# Patient Record
Sex: Female | Born: 1980 | Race: Black or African American | Hispanic: No | Marital: Married | Smoking: Never smoker
Health system: Southern US, Community
[De-identification: ages and names within clinical notes are randomized; demographics above are authoritative.]

## PROBLEM LIST (undated history)

## (undated) DIAGNOSIS — Z5189 Encounter for other specified aftercare: Secondary | ICD-10-CM

## (undated) DIAGNOSIS — G43909 Migraine, unspecified, not intractable, without status migrainosus: Secondary | ICD-10-CM

## (undated) DIAGNOSIS — O139 Gestational [pregnancy-induced] hypertension without significant proteinuria, unspecified trimester: Secondary | ICD-10-CM

## (undated) DIAGNOSIS — R011 Cardiac murmur, unspecified: Secondary | ICD-10-CM

## (undated) HISTORY — DX: Cardiac murmur, unspecified: R01.1

---

## 2001-04-04 ENCOUNTER — Encounter: Payer: Self-pay | Admitting: *Deleted

## 2001-04-04 ENCOUNTER — Emergency Department (HOSPITAL_COMMUNITY): Admission: EM | Admit: 2001-04-04 | Discharge: 2001-04-04 | Payer: Self-pay | Admitting: *Deleted

## 2002-05-24 ENCOUNTER — Emergency Department (HOSPITAL_COMMUNITY): Admission: EM | Admit: 2002-05-24 | Discharge: 2002-05-25 | Payer: Self-pay | Admitting: Emergency Medicine

## 2002-09-17 HISTORY — PX: TONSILLECTOMY: SUR1361

## 2002-12-05 ENCOUNTER — Emergency Department (HOSPITAL_COMMUNITY): Admission: EM | Admit: 2002-12-05 | Discharge: 2002-12-05 | Payer: Self-pay | Admitting: Emergency Medicine

## 2003-09-15 ENCOUNTER — Encounter (INDEPENDENT_AMBULATORY_CARE_PROVIDER_SITE_OTHER): Payer: Self-pay | Admitting: Specialist

## 2003-09-15 ENCOUNTER — Ambulatory Visit (HOSPITAL_COMMUNITY): Admission: RE | Admit: 2003-09-15 | Discharge: 2003-09-15 | Payer: Self-pay | Admitting: *Deleted

## 2003-09-15 ENCOUNTER — Ambulatory Visit (HOSPITAL_BASED_OUTPATIENT_CLINIC_OR_DEPARTMENT_OTHER): Admission: RE | Admit: 2003-09-15 | Discharge: 2003-09-15 | Payer: Self-pay | Admitting: *Deleted

## 2004-11-27 ENCOUNTER — Emergency Department (HOSPITAL_COMMUNITY): Admission: EM | Admit: 2004-11-27 | Discharge: 2004-11-27 | Payer: Self-pay | Admitting: Emergency Medicine

## 2005-04-07 ENCOUNTER — Emergency Department (HOSPITAL_COMMUNITY): Admission: EM | Admit: 2005-04-07 | Discharge: 2005-04-08 | Payer: Self-pay | Admitting: Emergency Medicine

## 2005-10-25 ENCOUNTER — Emergency Department (HOSPITAL_COMMUNITY): Admission: EM | Admit: 2005-10-25 | Discharge: 2005-10-25 | Payer: Self-pay | Admitting: Emergency Medicine

## 2006-04-01 ENCOUNTER — Emergency Department (HOSPITAL_COMMUNITY): Admission: EM | Admit: 2006-04-01 | Discharge: 2006-04-01 | Payer: Self-pay | Admitting: Emergency Medicine

## 2007-04-27 ENCOUNTER — Emergency Department (HOSPITAL_COMMUNITY): Admission: EM | Admit: 2007-04-27 | Discharge: 2007-04-27 | Payer: Self-pay | Admitting: Emergency Medicine

## 2008-01-26 ENCOUNTER — Emergency Department (HOSPITAL_COMMUNITY): Admission: EM | Admit: 2008-01-26 | Discharge: 2008-01-26 | Payer: Self-pay | Admitting: Emergency Medicine

## 2008-09-17 DIAGNOSIS — Z5189 Encounter for other specified aftercare: Secondary | ICD-10-CM

## 2008-09-17 HISTORY — DX: Encounter for other specified aftercare: Z51.89

## 2009-03-16 ENCOUNTER — Inpatient Hospital Stay (HOSPITAL_COMMUNITY): Admission: AD | Admit: 2009-03-16 | Discharge: 2009-03-20 | Payer: Self-pay | Admitting: Obstetrics & Gynecology

## 2009-03-17 ENCOUNTER — Encounter (INDEPENDENT_AMBULATORY_CARE_PROVIDER_SITE_OTHER): Payer: Self-pay | Admitting: Obstetrics

## 2009-03-21 ENCOUNTER — Encounter: Admission: RE | Admit: 2009-03-21 | Discharge: 2009-04-20 | Payer: Self-pay | Admitting: Obstetrics and Gynecology

## 2009-04-21 ENCOUNTER — Encounter: Admission: RE | Admit: 2009-04-21 | Discharge: 2009-05-21 | Payer: Self-pay | Admitting: Obstetrics and Gynecology

## 2009-05-22 ENCOUNTER — Encounter: Admission: RE | Admit: 2009-05-22 | Discharge: 2009-06-20 | Payer: Self-pay | Admitting: Obstetrics and Gynecology

## 2009-06-21 ENCOUNTER — Encounter: Admission: RE | Admit: 2009-06-21 | Discharge: 2009-07-21 | Payer: Self-pay | Admitting: Obstetrics and Gynecology

## 2009-07-22 ENCOUNTER — Encounter: Admission: RE | Admit: 2009-07-22 | Discharge: 2009-08-20 | Payer: Self-pay | Admitting: Obstetrics and Gynecology

## 2009-08-21 ENCOUNTER — Encounter: Admission: RE | Admit: 2009-08-21 | Discharge: 2009-09-15 | Payer: Self-pay | Admitting: Obstetrics and Gynecology

## 2009-09-21 ENCOUNTER — Encounter: Admission: RE | Admit: 2009-09-21 | Discharge: 2009-10-21 | Payer: Self-pay | Admitting: Obstetrics and Gynecology

## 2009-10-22 ENCOUNTER — Encounter: Admission: RE | Admit: 2009-10-22 | Discharge: 2009-11-21 | Payer: Self-pay | Admitting: Obstetrics and Gynecology

## 2009-12-20 ENCOUNTER — Encounter: Admission: RE | Admit: 2009-12-20 | Discharge: 2010-01-19 | Payer: Self-pay | Admitting: Obstetrics and Gynecology

## 2010-01-20 ENCOUNTER — Encounter: Admission: RE | Admit: 2010-01-20 | Discharge: 2010-02-19 | Payer: Self-pay | Admitting: Obstetrics and Gynecology

## 2010-02-20 ENCOUNTER — Encounter: Admission: RE | Admit: 2010-02-20 | Discharge: 2010-03-16 | Payer: Self-pay | Admitting: Obstetrics and Gynecology

## 2010-03-23 ENCOUNTER — Encounter: Admission: RE | Admit: 2010-03-23 | Discharge: 2010-04-22 | Payer: Self-pay | Admitting: Obstetrics and Gynecology

## 2010-04-23 ENCOUNTER — Encounter: Admission: RE | Admit: 2010-04-23 | Discharge: 2010-05-23 | Payer: Self-pay | Admitting: Obstetrics and Gynecology

## 2010-05-24 ENCOUNTER — Encounter: Admission: RE | Admit: 2010-05-24 | Discharge: 2010-06-06 | Payer: Self-pay | Admitting: Obstetrics and Gynecology

## 2010-05-31 ENCOUNTER — Emergency Department (HOSPITAL_COMMUNITY): Admission: EM | Admit: 2010-05-31 | Discharge: 2010-05-31 | Payer: Self-pay | Admitting: Emergency Medicine

## 2010-06-03 ENCOUNTER — Emergency Department (HOSPITAL_COMMUNITY): Admission: EM | Admit: 2010-06-03 | Discharge: 2010-06-03 | Payer: Self-pay | Admitting: Emergency Medicine

## 2010-06-24 ENCOUNTER — Encounter: Admission: RE | Admit: 2010-06-24 | Discharge: 2010-07-24 | Payer: Self-pay | Admitting: Obstetrics and Gynecology

## 2010-07-25 ENCOUNTER — Encounter
Admission: RE | Admit: 2010-07-25 | Discharge: 2010-08-24 | Payer: Self-pay | Source: Home / Self Care | Attending: Obstetrics and Gynecology | Admitting: Obstetrics and Gynecology

## 2010-08-25 ENCOUNTER — Encounter
Admission: RE | Admit: 2010-08-25 | Discharge: 2010-09-24 | Payer: Self-pay | Source: Home / Self Care | Attending: Obstetrics and Gynecology | Admitting: Obstetrics and Gynecology

## 2010-09-25 ENCOUNTER — Encounter
Admission: RE | Admit: 2010-09-25 | Discharge: 2010-10-17 | Payer: Self-pay | Source: Home / Self Care | Attending: Obstetrics and Gynecology | Admitting: Obstetrics and Gynecology

## 2010-10-07 ENCOUNTER — Encounter: Payer: Self-pay | Admitting: Family Medicine

## 2010-10-08 ENCOUNTER — Encounter: Payer: Self-pay | Admitting: Obstetrics and Gynecology

## 2010-10-26 ENCOUNTER — Encounter (HOSPITAL_COMMUNITY)
Admission: RE | Admit: 2010-10-26 | Discharge: 2010-10-26 | Disposition: A | Discharge status: Home / Self Care | Payer: Self-pay | Source: Ambulatory Visit | Attending: Obstetrics and Gynecology | Admitting: Obstetrics and Gynecology

## 2010-10-26 DIAGNOSIS — O923 Agalactia: Secondary | ICD-10-CM | POA: Insufficient documentation

## 2010-11-26 ENCOUNTER — Encounter (HOSPITAL_COMMUNITY)
Admission: RE | Admit: 2010-11-26 | Discharge: 2010-11-26 | Disposition: A | Discharge status: Home / Self Care | Payer: BC Managed Care – PPO | Source: Ambulatory Visit | Attending: Obstetrics and Gynecology | Admitting: Obstetrics and Gynecology

## 2010-11-26 DIAGNOSIS — O923 Agalactia: Secondary | ICD-10-CM | POA: Insufficient documentation

## 2010-12-24 LAB — COMPREHENSIVE METABOLIC PANEL WITH GFR
ALT: 12 U/L (ref 0–35)
ALT: 12 U/L (ref 0–35)
ALT: 12 U/L (ref 0–35)
AST: 26 U/L (ref 0–37)
AST: 27 U/L (ref 0–37)
AST: 29 U/L (ref 0–37)
Albumin: 2 g/dL — ABNORMAL LOW (ref 3.5–5.2)
Albumin: 2.1 g/dL — ABNORMAL LOW (ref 3.5–5.2)
Albumin: 2.2 g/dL — ABNORMAL LOW (ref 3.5–5.2)
Alkaline Phosphatase: 140 U/L — ABNORMAL HIGH (ref 39–117)
Alkaline Phosphatase: 160 U/L — ABNORMAL HIGH (ref 39–117)
Alkaline Phosphatase: 188 U/L — ABNORMAL HIGH (ref 39–117)
BUN: 10 mg/dL (ref 6–23)
BUN: 12 mg/dL (ref 6–23)
BUN: 7 mg/dL (ref 6–23)
CO2: 24 meq/L (ref 19–32)
CO2: 25 meq/L (ref 19–32)
CO2: 26 meq/L (ref 19–32)
Calcium: 7.9 mg/dL — ABNORMAL LOW (ref 8.4–10.5)
Calcium: 8.1 mg/dL — ABNORMAL LOW (ref 8.4–10.5)
Calcium: 8.4 mg/dL (ref 8.4–10.5)
Chloride: 107 meq/L (ref 96–112)
Chloride: 107 meq/L (ref 96–112)
Chloride: 108 meq/L (ref 96–112)
Creatinine, Ser: 0.68 mg/dL (ref 0.4–1.2)
Creatinine, Ser: 0.78 mg/dL (ref 0.4–1.2)
Creatinine, Ser: 1.15 mg/dL (ref 0.4–1.2)
GFR calc non Af Amer: 57 mL/min — ABNORMAL LOW
GFR calc non Af Amer: >60 mL/min
GFR calc non Af Amer: >60 mL/min
Glucose, Bld: 101 mg/dL — ABNORMAL HIGH (ref 70–99)
Glucose, Bld: 58 mg/dL — ABNORMAL LOW (ref 70–99)
Glucose, Bld: 87 mg/dL (ref 70–99)
Potassium: 3.7 meq/L (ref 3.5–5.1)
Potassium: 3.9 meq/L (ref 3.5–5.1)
Potassium: 4 meq/L (ref 3.5–5.1)
Sodium: 136 meq/L (ref 135–145)
Sodium: 138 meq/L (ref 135–145)
Sodium: 139 meq/L (ref 135–145)
Total Bilirubin: 0.6 mg/dL (ref 0.3–1.2)
Total Bilirubin: 0.6 mg/dL (ref 0.3–1.2)
Total Bilirubin: 0.9 mg/dL (ref 0.3–1.2)
Total Protein: 4.4 g/dL — ABNORMAL LOW (ref 6.0–8.3)
Total Protein: 4.7 g/dL — ABNORMAL LOW (ref 6.0–8.3)
Total Protein: 4.8 g/dL — ABNORMAL LOW (ref 6.0–8.3)

## 2010-12-24 LAB — CBC
HCT: 19.5 % — ABNORMAL LOW (ref 36.0–46.0)
HCT: 21.6 % — ABNORMAL LOW (ref 36.0–46.0)
HCT: 23.6 % — ABNORMAL LOW (ref 36.0–46.0)
HCT: 24.8 % — ABNORMAL LOW (ref 36.0–46.0)
HCT: 26.9 % — ABNORMAL LOW (ref 36.0–46.0)
HCT: 32.7 % — ABNORMAL LOW (ref 36.0–46.0)
Hemoglobin: 11.3 g/dL — ABNORMAL LOW (ref 12.0–15.0)
Hemoglobin: 6.9 g/dL — CL (ref 12.0–15.0)
Hemoglobin: 7.5 g/dL — CL (ref 12.0–15.0)
Hemoglobin: 8.8 g/dL — ABNORMAL LOW (ref 12.0–15.0)
Hemoglobin: 9.4 g/dL — ABNORMAL LOW (ref 12.0–15.0)
MCHC: 34.5 g/dL (ref 30.0–36.0)
MCHC: 34.8 g/dL (ref 30.0–36.0)
MCHC: 34.9 g/dL (ref 30.0–36.0)
MCHC: 35.3 g/dL (ref 30.0–36.0)
MCHC: 35.4 g/dL (ref 30.0–36.0)
MCV: 90.9 fL (ref 78.0–100.0)
MCV: 91 fL (ref 78.0–100.0)
MCV: 91.2 fL (ref 78.0–100.0)
MCV: 91.8 fL (ref 78.0–100.0)
MCV: 91.9 fL (ref 78.0–100.0)
Platelets: 130 K/uL — ABNORMAL LOW (ref 150–400)
Platelets: 137 10*3/uL — ABNORMAL LOW (ref 150–400)
Platelets: 147 K/uL — ABNORMAL LOW (ref 150–400)
Platelets: 150 K/uL (ref 150–400)
Platelets: 163 K/uL (ref 150–400)
Platelets: 197 K/uL (ref 150–400)
RBC: 2.12 MIL/uL — ABNORMAL LOW (ref 3.87–5.11)
RBC: 2.34 MIL/uL — ABNORMAL LOW (ref 3.87–5.11)
RBC: 2.73 MIL/uL — ABNORMAL LOW (ref 3.87–5.11)
RBC: 2.96 MIL/uL — ABNORMAL LOW (ref 3.87–5.11)
RBC: 3.59 MIL/uL — ABNORMAL LOW (ref 3.87–5.11)
RDW: 14.4 % (ref 11.5–15.5)
RDW: 14.6 % (ref 11.5–15.5)
RDW: 14.8 % (ref 11.5–15.5)
RDW: 14.8 % (ref 11.5–15.5)
RDW: 14.8 % (ref 11.5–15.5)
WBC: 10.6 K/uL — ABNORMAL HIGH (ref 4.0–10.5)
WBC: 11.1 K/uL — ABNORMAL HIGH (ref 4.0–10.5)
WBC: 13.4 K/uL — ABNORMAL HIGH (ref 4.0–10.5)
WBC: 13.8 10*3/uL — ABNORMAL HIGH (ref 4.0–10.5)
WBC: 7.3 K/uL (ref 4.0–10.5)
WBC: 9.8 K/uL (ref 4.0–10.5)

## 2010-12-24 LAB — GLUCOSE, CAPILLARY: Glucose-Capillary: 63 mg/dL — ABNORMAL LOW (ref 70–99)

## 2010-12-24 LAB — CREATININE, SERUM
Creatinine, Ser: 0.68 mg/dL (ref 0.4–1.2)
GFR calc non Af Amer: >60 mL/min

## 2010-12-24 LAB — COMPREHENSIVE METABOLIC PANEL
ALT: 13 U/L (ref 0–35)
BUN: 10 mg/dL (ref 6–23)
CO2: 22 mEq/L (ref 19–32)
Calcium: 9 mg/dL (ref 8.4–10.5)
Creatinine, Ser: 0.55 mg/dL (ref 0.4–1.2)
GFR calc non Af Amer: >60 mL/min (ref >60)
Glucose, Bld: 71 mg/dL (ref 70–99)

## 2010-12-24 LAB — URIC ACID
Uric Acid, Serum: 5.5 mg/dL (ref 2.4–7.0)
Uric Acid, Serum: 5.7 mg/dL (ref 2.4–7.0)
Uric Acid, Serum: 5.8 mg/dL (ref 2.4–7.0)
Uric Acid, Serum: 6.1 mg/dL (ref 2.4–7.0)

## 2010-12-24 LAB — DIFFERENTIAL
Basophils Absolute: 0 K/uL (ref 0.0–0.1)
Basophils Relative: 0 % (ref 0–1)
Eosinophils Absolute: 0.1 K/uL (ref 0.0–0.7)
Eosinophils Relative: 0 % (ref 0–5)
Lymphocytes Relative: 8 % — ABNORMAL LOW (ref 12–46)
Lymphs Abs: 1 K/uL (ref 0.7–4.0)
Monocytes Absolute: 1.1 K/uL — ABNORMAL HIGH (ref 0.1–1.0)
Monocytes Relative: 8 % (ref 3–12)
Neutro Abs: 11.3 K/uL — ABNORMAL HIGH (ref 1.7–7.7)
Neutrophils Relative %: 84 % — ABNORMAL HIGH (ref 43–77)

## 2010-12-24 LAB — URINE MICROSCOPIC-ADD ON

## 2010-12-24 LAB — CROSSMATCH
ABO/RH(D): A POS
Antibody Screen: NEGATIVE

## 2010-12-24 LAB — CREATININE, URINE, RANDOM: Creatinine, Urine: 214.1 mg/dL

## 2010-12-24 LAB — URINALYSIS, ROUTINE W REFLEX MICROSCOPIC
Bilirubin Urine: NEGATIVE
Glucose, UA: NEGATIVE mg/dL
Ketones, ur: 15 mg/dL — AB
Nitrite: NEGATIVE
Protein, ur: 30 mg/dL — AB
Specific Gravity, Urine: 1.025 (ref 1.005–1.030)
Urobilinogen, UA: 0.2 mg/dL (ref 0.0–1.0)
pH: 6 (ref 5.0–8.0)

## 2010-12-24 LAB — ABO/RH: ABO/RH(D): A POS

## 2010-12-25 LAB — COMPREHENSIVE METABOLIC PANEL
AST: 22 U/L (ref 0–37)
Albumin: 3.3 g/dL — ABNORMAL LOW (ref 3.5–5.2)
BUN: 9 mg/dL (ref 6–23)
Calcium: 9.5 mg/dL (ref 8.4–10.5)
Creatinine, Ser: 0.53 mg/dL (ref 0.4–1.2)
GFR calc Af Amer: >60 mL/min (ref >60)
GFR calc non Af Amer: >60 mL/min (ref >60)

## 2010-12-25 LAB — CBC
HCT: 35.9 % — ABNORMAL LOW (ref 36.0–46.0)
MCHC: 34.6 g/dL (ref 30.0–36.0)
MCV: 90.9 fL (ref 78.0–100.0)
Platelets: 220 10*3/uL (ref 150–400)
RDW: 14.1 % (ref 11.5–15.5)

## 2010-12-25 LAB — URIC ACID: Uric Acid, Serum: 5 mg/dL (ref 2.4–7.0)

## 2010-12-27 ENCOUNTER — Encounter (HOSPITAL_COMMUNITY)
Admission: RE | Admit: 2010-12-27 | Discharge: 2010-12-27 | Disposition: A | Discharge status: Home / Self Care | Payer: BC Managed Care – PPO | Source: Ambulatory Visit | Attending: Obstetrics and Gynecology | Admitting: Obstetrics and Gynecology

## 2010-12-27 DIAGNOSIS — O923 Agalactia: Secondary | ICD-10-CM | POA: Insufficient documentation

## 2011-01-21 ENCOUNTER — Emergency Department (HOSPITAL_COMMUNITY)
Admission: EM | Admit: 2011-01-21 | Discharge: 2011-01-21 | Disposition: A | Discharge status: Home / Self Care | Payer: BC Managed Care – PPO | Attending: Emergency Medicine | Admitting: Emergency Medicine

## 2011-01-21 DIAGNOSIS — N390 Urinary tract infection, site not specified: Secondary | ICD-10-CM | POA: Insufficient documentation

## 2011-01-21 DIAGNOSIS — G43909 Migraine, unspecified, not intractable, without status migrainosus: Secondary | ICD-10-CM | POA: Insufficient documentation

## 2011-01-21 LAB — URINALYSIS, ROUTINE W REFLEX MICROSCOPIC
Glucose, UA: NEGATIVE mg/dL
Nitrite: NEGATIVE
Protein, ur: 30 mg/dL — AB
pH: 6 (ref 5.0–8.0)

## 2011-01-21 LAB — URINE MICROSCOPIC-ADD ON

## 2011-01-21 LAB — POCT PREGNANCY, URINE: Preg Test, Ur: NEGATIVE

## 2011-01-23 LAB — URINE CULTURE
Colony Count: >=100000
Culture  Setup Time: 201205062036

## 2011-01-27 ENCOUNTER — Encounter (HOSPITAL_COMMUNITY)
Admission: RE | Admit: 2011-01-27 | Discharge: 2011-01-27 | Disposition: A | Discharge status: Home / Self Care | Payer: BC Managed Care – PPO | Source: Ambulatory Visit | Attending: Obstetrics and Gynecology | Admitting: Obstetrics and Gynecology

## 2011-01-27 DIAGNOSIS — O923 Agalactia: Secondary | ICD-10-CM | POA: Insufficient documentation

## 2011-01-30 NOTE — Discharge Summary (Signed)
Alicia Bryant, Alicia Bryant              ACCOUNT NO.:  [id number]   MEDICAL RECORD NO.:  [id number]          PATIENT TYPE:  INP   LOCATION:  9126                          FACILITY:  [facility]   PHYSICIAN:  Lendon Colonel, MD   DATE OF BIRTH:  09-05-1981   DATE OF ADMISSION:  03/16/2009  DATE OF DISCHARGE:  03/20/2009                               DISCHARGE SUMMARY   CHIEF COMPLAINT:  Elevated blood pressure, rule out labor.   HISTORY OF PRESENT ILLNESS:  This is a 30 year old G2, P0 presenting at  37 weeks and 5 days for evaluation of elevated blood pressure.  Her  prenatal course was significant for later transfer of care at [facility]  OB/GYN.   ALLERGIES:  She had allergies to BANANA.   MEDICATIONS:  Prenatal vitamins.   OBSTETRICAL HISTORY:  Significant for 1 prior therapeutic AB.   GYNECOLOGIC HISTORY:  Negative medical history significant for migraine.   PAST SURGERIES:  Only a D and E.   She is married.   Labs are per her admission H and P.  She did have an elevated 1-hour  diabetic screen within normal 3-hour tests.   On admission, she was 4 cm dilated, 60% effaced with a vertex in the -3  position with palpable membranes.  Remainder of her examination was  unremarkable.  She did have elevated blood pressure.  Her plan was for  admission to follow her contraction pattern and cervical exam, also to  evaluate her for a preeclampsia.  A 24-hour urine was began collected  for creatinine and protein and labs were collected for evaluation of  pregnancy-induced hypertension.   HOSPITAL COURSE:  The patient was admitted with the plan as above.  She  was given pain medicines as needed.  By the next morning, the patient  was complaining of painful contractions despite pain medicine.  She  denied headache, visual symptoms change and was having good fetal  movement.  Her cervix was 4 cm, 60% effaced in the -2 position.  Fetal  tracing was reactive and she was contracting every 2-4  minutes.  The  patient continued to be observed with a 24-hour urine in progress.  Her  blood pressures were 140s-160s over 80s-90s.  Later that day, the  patient's blood pressure had risen to 177/96 and she was now breathing  with her contractions.  She was progressed to 5 cm with bulging  membranes.  A BPP was done, that was 6/8-2 for breathing.  Decision was  made.  The patient was in active labor, and decision was made to augment  her labor.  She did receive an epidural and rupture of membranes was  done.  At 3:45 p.m., her blood pressure was 176/88.  Fetal testing was  overall reactive with good scalp stim, although baby did have a 4-minute  decels to the 80s after rupture of membranes.  Thin meconium was noted.  Recurrent decelerations were also noted and close observation was  planned.  The patient then progressed to have additional variable  decelerations.  An IUPC was placed and amnioinfusion was started and  terbutaline was given.  Blood pressures continued to be variable and the  patient did have several doses of labetalol for blood pressure control.  The patient then had about 8-minute bradycardia in the baby to the 60s  and given the bradycardia and no further cervical change, a decision was  made to proceed with an urgent primary low transverse cesarean section,  that operative note will be found in a separate dictation.  Briefly, the  pre and postoperative diagnosis for fetal bradycardia, the patient done  a primary low transverse cesarean section, finding a female infant in the  ROP position, weight 5 pounds 15 ounces, Apgars 8 and 9, cord pH of  7.23.  Normal uterus, tubes and ovaries.  The patient did have an 800 mL  EBC with 1 g of Ancef given during the surgery.  During the C-section,  it was noted that the uterine incision was quite low approach in the  cervix and a left lower extension was found.  There was also some  extension towards and into the broad ligament on the  right side.  There  was additionally some bleeding along the posterior aspect of the uterus  that was controlled with cautery.  Postoperatively, the patient stated  that she was feeling fine.  She was noted to be excessively lethargic  and fatigued.  A hemoglobin was drawn which returned at 7.5.  Later that  day her hemoglobin had dropped to 6.9.  The patient was tachycardic and  a systolic flow murmur was noted.  On exam, she was arousable and  oriented, but did fall asleep while talking.  Her creatinine was also  elevated.  Initial concerns were for worsening preeclampsia, acute blood  loss anemia with possible persistent blood loss even after the C-  section.  Her abdomen was slightly distended and there was concern for  continuing blood loss.  The patient also did have chorioamnionitis and  was started on gent and clinda postpartum.  There was concerns that  gentamicin may be increasing her creatinine.  The patient continued her  clindamycin, but the gentamicin was discontinued.  She was placed on  strict in's and out's.  She did have 2 units of blood and was closely  followed for preeclampsia.  She did get Lasix in between her units of  blood.  After her blood, her creatinine dropped to 0.68, her hemoglobin  rose to 8.8 and she was appearing much better with more energy levels  and good eye contact.  She still did have the systolic flow murmur.  Her  lungs were clear with no crackles and her bowel function was improving.  From that point, the patient continued to improve.  Her blood pressure  still remained moderately elevated, but this was followed and by  postpartum day #3, her blood pressures were within normal range, she was  ambulating and voiding, tolerating regular p.o. and she was discharged  to home.   DISCHARGE DIAGNOSES:  Preeclampsia, fetal intolerance to labor, status  post primary low transverse cesarean section, acute blood loss anemia.   DISCHARGE MEDICATIONS:   Motrin, Colace, Percocet, and iron.   CONDITION:  Stable.   DISPOSITION:  To home.      Lendon Colonel, MD  Electronically Signed     KAF/MEDQ  D:  04/23/2009  T:  04/24/2009  Job:  [id number]

## 2011-01-30 NOTE — Op Note (Signed)
Alicia Bryant, Alicia Bryant              ACCOUNT NO.:  [id number]   MEDICAL RECORD NO.:  [id number]          PATIENT TYPE:  INP   LOCATION:  9126                          FACILITY:  [facility]   PHYSICIAN:  Lendon Colonel, MD   DATE OF BIRTH:  1980-11-23   DATE OF PROCEDURE:  03/17/2009  DATE OF DISCHARGE:                               OPERATIVE REPORT   PREOPERATIVE DIAGNOSIS:  Fetal bradycardia.   POSTOPERATIVE DIAGNOSIS:  Fetal bradycardia.   PROCEDURE:  Primary low-transverse cesarean section.   SURGEON:  Lendon Colonel, MD   ANESTHESIA:  Epidural.   FINDINGS:  Female infant in the ROP position with some asynchronism,  weight 5 pounds 15 ounces, Apgars 8 and 9, cord pH 7.23.  Normal uterus,  tubes, and ovaries.  Thin meconium with terminal meconium as well.   SPECIMENS:  Placenta, cord pH.   DISPOSITION:  Pathology.   ANTIBIOTICS:  1 g of Ancef.   ESTIMATED BLOOD LOSS:  800 mL.   COMPLICATIONS:  None.   INDICATIONS:  This is a 30 year old G1 at 37 weeks and 3 days who was  admitted for pregnancy-induced hypertension.  Through the course of the  night, her blood pressure remained elevated, although her labs were  negative for preeclampsia.  She began contracting through the night, her  cervix changed to 4 then 5 cm.  The patient became increasingly  uncomfortable and had epidural placed for anesthesia.  After she was  comfortable with an epidural her blood pressures continued to rise  needing IV labetalol 10 and  then 20 mg. Decision was made to augment  her labor.  The patient was AROM for meconium.  Over the subsequent 6  hours, the patient had several variable decelerations and periods of  nonreactive fetal testing.  Fetal reassurance was always obtained via  scalp skin; however, terbutaline was needed and IUPC was placed for a  longer/ deeper variable deceleratin.  Amnioinfusion was given and the  fetal scalp electrode were applied throughout the subsequent 6 hours  between AROM and decision for cesarean section.  Just prior to the  delivery for cesarean section, 8-minute bradycardia to the 60s was  noted.  The patient made no further cervical change and decision was  made to proceed with an urgent primary low-transverse cesarean section.   PROCEDURE:  After informed consent was obtained, the patient was taken  to the operating room where epidural anesthesia was eventaully found to  be adequate. It did take several minutes to get her epidural at the  right level.  Foley catheter was inserted in the labor room.  Pfannenstiel skin incision was made 2 cm above the pubic symphysis in  the midline with the scalpel and carried through skin and underlying  layer of fascia with the Bovie cautery.  The fascia was incised in the  midline and the incision was extended laterally.  Kocher clamps were  placed in the inferior aspect of the fascial incision, it was elevated  up, the underlying rectus muscles were dissected off sharply.  The  fascia was then grasped with Kocher clamps on  the upper aspect, the  underlying rectus muscles were dissected off sharply and with the Bovie  the rectus muscles were bluntly divided in the midline.  The pyramidalis  muscles were divided sharply with the Mayo scissors.  Peritoneum was  identified and entered bluntly.  Peritoneal incision was extended  superiorly and inferiorly with good visualization of the bladder.  The  bladder blade was inserted.  The vesicouterine peritoneum was  identified, grasped with pickups, entered sharply with Metzenbaum  scissors.  The incision was extended laterally and the bladder flap was  created digitally.  Bladder blade was re-inserted.  The lower uterine  segment was incised in transverse fashion with a scalpel and extended  bluntly.  The infant's occiput was grasped, brought to the incision.  Nuchal cord x2 was reduced.  The remainder of the infant was delivered  without complications.  Cord  was clamped and cut and the infant was  handed off to awaiting pediatrician.  Cord gas and cord pH were both  obtained.  The placenta was then expressed.  No abruption was noted.  The uterus was exteriorized, cleared of all clots, and debris.  The  uterine incision was then found to be quite low and approaching the  cervix.  The left lower extension was repaired primarily with 0-Vicryl.  The uterine incision was then repaired with 0-Vicryl in a running locked  fashion.  A second layer of the same suture was used in an imbricating  fashion to obtain good hemostasis.  The main part of the uterine  incision was noted to be hemostatic.  Some bleeding was noted from the  angle of the left extension. Using hand to pull away the uterine  vessels, a figure-of-eight suture was carefully placed in this area that  was approaching the broad ligament.  Good hemostasis was noted after the  placement of figure-of-eight suture.  On the right side an extension  down into the broad ligament was noted.  This was carefully repaired  with 2 separate figure-of-eight sutures.  The posterior aspect of the  uterus along the right broad ligament was noted to be bleeding.  This  was controlled with cautious autery.  This was inspected several times  throughout the repair of the uterus was placed into the abdomen several  times and then removed for reevaluation of this area.  Good hemostasis  was noted by the end of the case.  The peritoneum was then closed with 2-  0 Vicryl in running fashion.  The cut muscle edges on either side of the  fascia were inspected, found to be hemostatic, and the fascia was closed  with 0-Vicryl in a running fashion in a single layer.  Skin was closed  with staples after the subcutaneous tissue was irrigated.  The patient  tolerated the procedure well.  Sponge, lap, and needle counts were  correct x3, and the patient was taken to recovery room in stable  condition.  Clear urine was noted  at the end of the case and was  appropriate for the time in the OR.      Lendon Colonel, MD  Electronically Signed     KAF/MEDQ  D:  03/17/2009  T:  03/18/2009  Job:  [id number]

## 2011-02-27 ENCOUNTER — Encounter (HOSPITAL_COMMUNITY)
Admission: RE | Admit: 2011-02-27 | Discharge: 2011-02-27 | Disposition: A | Discharge status: Home / Self Care | Payer: BC Managed Care – PPO | Source: Ambulatory Visit | Attending: Obstetrics and Gynecology | Admitting: Obstetrics and Gynecology

## 2011-02-27 DIAGNOSIS — O923 Agalactia: Secondary | ICD-10-CM | POA: Insufficient documentation

## 2011-03-30 ENCOUNTER — Encounter (HOSPITAL_COMMUNITY)
Admission: RE | Admit: 2011-03-30 | Discharge: 2011-03-30 | Disposition: A | Discharge status: Home / Self Care | Payer: BC Managed Care – PPO | Source: Ambulatory Visit | Attending: Obstetrics and Gynecology | Admitting: Obstetrics and Gynecology

## 2011-03-30 DIAGNOSIS — O923 Agalactia: Secondary | ICD-10-CM | POA: Insufficient documentation

## 2011-04-30 ENCOUNTER — Encounter (HOSPITAL_COMMUNITY)
Admission: RE | Admit: 2011-04-30 | Discharge: 2011-04-30 | Disposition: A | Discharge status: Home / Self Care | Payer: BC Managed Care – PPO | Source: Ambulatory Visit | Attending: Obstetrics and Gynecology | Admitting: Obstetrics and Gynecology

## 2011-04-30 DIAGNOSIS — O923 Agalactia: Secondary | ICD-10-CM | POA: Insufficient documentation

## 2011-05-31 ENCOUNTER — Encounter (HOSPITAL_COMMUNITY)
Admission: RE | Admit: 2011-05-31 | Discharge: 2011-05-31 | Disposition: A | Discharge status: Home / Self Care | Payer: BC Managed Care – PPO | Source: Ambulatory Visit | Attending: Obstetrics and Gynecology | Admitting: Obstetrics and Gynecology

## 2011-05-31 DIAGNOSIS — O923 Agalactia: Secondary | ICD-10-CM | POA: Insufficient documentation

## 2011-07-01 ENCOUNTER — Encounter (HOSPITAL_COMMUNITY)
Admission: RE | Admit: 2011-07-01 | Discharge: 2011-07-01 | Disposition: A | Discharge status: Home / Self Care | Payer: BC Managed Care – PPO | Source: Ambulatory Visit | Attending: Obstetrics and Gynecology | Admitting: Obstetrics and Gynecology

## 2011-07-01 DIAGNOSIS — O923 Agalactia: Secondary | ICD-10-CM | POA: Insufficient documentation

## 2011-08-01 ENCOUNTER — Encounter (HOSPITAL_COMMUNITY)
Admission: RE | Admit: 2011-08-01 | Discharge: 2011-08-01 | Disposition: A | Discharge status: Home / Self Care | Payer: BC Managed Care – PPO | Source: Ambulatory Visit | Attending: Obstetrics and Gynecology | Admitting: Obstetrics and Gynecology

## 2011-08-01 DIAGNOSIS — O923 Agalactia: Secondary | ICD-10-CM | POA: Insufficient documentation

## 2012-04-21 ENCOUNTER — Telehealth: Payer: Self-pay | Admitting: Obstetrics and Gynecology

## 2012-04-21 NOTE — Telephone Encounter (Signed)
JACKIE/RX °

## 2012-04-24 ENCOUNTER — Telehealth: Payer: Self-pay | Admitting: Obstetrics and Gynecology

## 2012-04-24 NOTE — Telephone Encounter (Signed)
Jackie/pam/ar pt. °

## 2012-04-28 ENCOUNTER — Telehealth: Payer: Self-pay | Admitting: Obstetrics and Gynecology

## 2012-04-28 NOTE — Telephone Encounter (Signed)
TRIAGE/2ND CALL

## 2012-04-28 NOTE — Telephone Encounter (Signed)
TRIAGE/COUPON

## 2012-04-28 NOTE — Telephone Encounter (Signed)
Ar pt 

## 2012-04-29 ENCOUNTER — Telehealth: Payer: Self-pay | Admitting: Obstetrics and Gynecology

## 2012-04-29 NOTE — Telephone Encounter (Signed)
Jackie/pam/rx [facility].

## 2012-04-30 ENCOUNTER — Telehealth: Payer: Self-pay

## 2012-04-30 NOTE — Telephone Encounter (Signed)
Pt had called requesting that her RX for Lo Estrin 24 FE be changed to Minastrin 24 FE so that she can use her coupon that we provided her. Rx for Minastrin 24 FE sig: 1 po qd disp #1 pk w/ RF's through 09/2012, per AR. Called to [place] [facility]. Melody Comas A

## 2012-05-08 ENCOUNTER — Encounter (HOSPITAL_COMMUNITY): Payer: Self-pay | Admitting: *Deleted

## 2012-05-08 ENCOUNTER — Emergency Department (HOSPITAL_COMMUNITY)
Admission: EM | Admit: 2012-05-08 | Discharge: 2012-05-08 | Disposition: A | Discharge status: Home / Self Care | Payer: 59 | Attending: Emergency Medicine | Admitting: Emergency Medicine

## 2012-05-08 DIAGNOSIS — R209 Unspecified disturbances of skin sensation: Secondary | ICD-10-CM | POA: Insufficient documentation

## 2012-05-08 DIAGNOSIS — R200 Anesthesia of skin: Secondary | ICD-10-CM

## 2012-05-08 HISTORY — DX: Migraine, unspecified, not intractable, without status migrainosus: G43.909

## 2012-05-08 LAB — GLUCOSE, CAPILLARY: Glucose-Capillary: 83 mg/dL (ref 70–99)

## 2012-05-08 NOTE — ED Notes (Signed)
Right side of face with tingling 2 days ago also to finger tips and feet, yesterday and today tingling to fingers and feet but not to face

## 2012-05-08 NOTE — ED Provider Notes (Signed)
History     CSN: [id number]  Arrival date & time 05/08/12  1916   First MD Initiated Contact with Patient 05/08/12 2104      Chief Complaint  Patient presents with  . Numbness    (Consider location/radiation/quality/duration/timing/severity/associated sxs/prior treatment) HPI... complains of numbness in right side of face, fingertips and feet for 24 hours. No frank neural deficits. No chronic medical problems. No injury. Symptoms are minimal. Nothing makes them better or worse  Past Medical History  Diagnosis Date  . Migraine     Past Surgical History  Procedure Date  . Cesarean section   . Tonsillectomy     No family history on file.  History  Substance Use Topics  . Smoking status: Never Smoker   . Smokeless tobacco: Not on file  . Alcohol Use: No    OB History    Grav Para Term Preterm Abortions TAB SAB Ect Mult Living                  Review of Systems  All other systems reviewed and are negative.    Allergies  Banana  Home Medications   Current Outpatient Rx  Name Route Sig Dispense Refill  . ADULT MULTIVITAMIN W/MINERALS CH Oral Take 1 tablet by mouth daily.    Azzie Roup ACE-ETH ESTRAD-FE 1-20 MG-MCG(24) PO CHEW Oral Chew 1 tablet by mouth at bedtime.      BP 135/78  Pulse 75  Temp 98.6 F (37 C) (Tympanic)  Resp 20  Ht 4\' 11"  (1.499 m)  Wt 150 lb (68.04 kg)  BMI 30.30 kg/m2  SpO2 100%  LMP 04/24/2012  Physical Exam  Nursing note and vitals reviewed. Constitutional: She is oriented to person, place, and time. She appears well-developed and well-nourished.  HENT:  Head: Normocephalic and atraumatic.  Eyes: Conjunctivae and EOM are normal. Pupils are equal, round, and reactive to light.  Neck: Normal range of motion. Neck supple.  Cardiovascular: Normal rate, regular rhythm and normal heart sounds.   Pulmonary/Chest: Effort normal and breath sounds normal.  Abdominal: Soft. Bowel sounds are normal.  Musculoskeletal: Normal range of  motion.  Neurological: She is alert and oriented to person, place, and time.  Skin: Skin is warm and dry.  Psychiatric: She has a normal mood and affect.    ED Course  Procedures (including critical care time)   Labs Reviewed  GLUCOSE, CAPILLARY  LAB REPORT - SCANNED   No results found.   1. Numbness       MDM  Patient has normal physical exam. Glucose 83. No gross neuro deficits        Donnetta Hutching, MD 05/17/12 [phone]

## 2012-08-05 ENCOUNTER — Telehealth: Payer: Self-pay | Admitting: Obstetrics and Gynecology

## 2012-08-05 ENCOUNTER — Other Ambulatory Visit: Payer: 59

## 2012-08-05 DIAGNOSIS — N912 Amenorrhea, unspecified: Secondary | ICD-10-CM

## 2012-08-06 ENCOUNTER — Telehealth: Payer: Self-pay | Admitting: Obstetrics and Gynecology

## 2012-08-06 NOTE — Telephone Encounter (Signed)
TC TO PT REGARDING QUANT TEST RESULTS. INFORMED PT THAT TEST RESULTS WERE NEGATIVE. PT VOICED UNDERSTANDING.

## 2013-06-03 ENCOUNTER — Ambulatory Visit (INDEPENDENT_AMBULATORY_CARE_PROVIDER_SITE_OTHER): Payer: PRIVATE HEALTH INSURANCE | Admitting: Cardiovascular Disease

## 2013-06-03 ENCOUNTER — Encounter: Payer: Self-pay | Admitting: Cardiovascular Disease

## 2013-06-03 VITALS — BP 100/70 | HR 75 | Resp 16 | Ht 59.0 in | Wt 153.7 lb

## 2013-06-03 DIAGNOSIS — R55 Syncope and collapse: Secondary | ICD-10-CM

## 2013-06-03 DIAGNOSIS — R011 Cardiac murmur, unspecified: Secondary | ICD-10-CM

## 2013-06-03 DIAGNOSIS — R002 Palpitations: Secondary | ICD-10-CM

## 2013-06-03 NOTE — Patient Instructions (Addendum)
Your physician has requested that you have an echocardiogram. Echocardiography is a painless test that uses sound waves to create images of your heart. It provides your doctor with information about the size and shape of your heart and how well your heart's chambers and valves are working. This procedure takes approximately one hour. There are no restrictions for this procedure.  We will call you with the results.  Your physician recommends that you schedule a follow-up appointment in: As Needed.

## 2013-06-11 ENCOUNTER — Encounter: Payer: Self-pay | Admitting: Cardiovascular Disease

## 2013-06-11 DIAGNOSIS — R55 Syncope and collapse: Secondary | ICD-10-CM | POA: Insufficient documentation

## 2013-06-11 NOTE — Assessment & Plan Note (Addendum)
Symptoms are highly compatible with vasovagal syncope. She has mild orthostatic hypotension. There was an approximately 10 mm of mercury drop in her blood pressure from laying to standing today. She did not have any symptoms with this. I have recommended that she stay very well hydrated and eat a relatively high sodium diet. Support stockings might be beneficial as would tilt training. I think he should try to avoid pharmacological therapy if possible since she is in the second trimester pregnancy. Also recommend an echocardiogram to exclude structural heart disease. Starch clear to me whether she just has a flow-related systolic murmur of pregnancy or whether her murmurs and eat a long-standing issue

## 2013-06-11 NOTE — Progress Notes (Signed)
Patient ID: GENELLA BAS, female   DOB: 09-Jun-1981, 32 y.o.   MRN: [id number]     Reason for office visit Presyncope  Eulalah is now [redacted] weeks pregnant. She has had a few episodes of lightheadedness including one episode of severe dizziness that lasted for about 15 minutes. It was worsened by bending over and trying to sit back up. It was preceded by sensation of flushing diaphoresis and visual blurring. He does have some rapid regular palpitations. By the time vital signs were checked her blood pressure was 120/80 and the heart was 90 (was already better). She never completely lost consciousness. She was sitting up all the time during that episode. Doesn't sound like hyperemesis has been a problem.  Her blood pressure when she was seen in her obstetricians office was quite low at 90/70 mm Hg. In office today laying down her blood pressure was 110/62, sitting up 106/62, standing up 100/70. There is no difference in blood pressure between her right and left upper extremities. She has a history of a murmur since childhood but I don't think this has ever been investigated. Other than that she is quite healthy with her medical history being limited to migraines and a remote arm fracture. Her father has high blood pressure and diabetes. She personally has never had gestational diabetes or hypertension that she is aware of.  Recent labs are all fairly normal considering her stage of pregnancy hemoglobin 11.2 TSH 1.0 glucose 81  Allergies  Allergen Reactions  . Banana Itching  . Shellfish Allergy     Throat itching     Current Outpatient Prescriptions  Medication Sig Dispense Refill  . Multiple Vitamin (MULTIVITAMIN WITH MINERALS) TABS Take 1 tablet by mouth daily.       No current facility-administered medications for this visit.    Past Medical History  Diagnosis Date  . Migraine   . Heart murmur     Past Surgical History  Procedure Laterality Date  . Cesarean section  2010  .  Tonsillectomy  2004    Family History  Problem Relation Age of Onset  . Hypertension Father   . Diabetes Father     History   Social History  . Marital Status: Married    Spouse Name: N/A    Number of Children: N/A  . Years of Education: N/A   Occupational History  . Not on file.   Social History Main Topics  . Smoking status: Never Smoker   . Smokeless tobacco: Not on file  . Alcohol Use: No  . Drug Use: No  . Sexual Activity:    Other Topics Concern  . Not on file   Social History Narrative  . No narrative on file    Review of systems: The patient specifically denies any chest pain at rest or with exertion, dyspnea at rest or with exertion, orthopnea, paroxysmal nocturnal dyspnea, syncope, palpitations, focal neurological deficits, intermittent claudication, lower extremity edema, unexplained weight gain, cough, hemoptysis or wheezing.  The patient also denies abdominal pain, nausea, vomiting, dysphagia, diarrhea, constipation, polyuria, polydipsia, dysuria, hematuria, frequency, urgency, abnormal bleeding or bruising, fever, chills, unexpected weight changes, mood swings, change in skin or hair texture, change in voice quality, auditory or visual problems, allergic reactions or rashes, new musculoskeletal complaints other than usual "aches and pains".   PHYSICAL EXAM BP 100/70  Pulse 75  Resp 16  Ht 4\' 11"  (1.499 m)  Wt 153 lb 11.2 oz (69.718 kg)  BMI 31.03 kg/m2  General: Alert, oriented x3, no distress Head: no evidence of trauma, PERRL, EOMI, no exophtalmos or lid lag, no myxedema, no xanthelasma; normal ears, nose and oropharynx Neck: normal jugular venous pulsations and no hepatojugular reflux; brisk carotid pulses without delay and no carotid bruits Chest: clear to auscultation, no signs of consolidation by percussion or palpation, normal fremitus, symmetrical and full respiratory excursions Cardiovascular: normal position and quality of the apical  impulse, regular rhythm, normal first and second heart sounds, no rubs or gallops, 1-2/6 SEM in pulmonic focus Abdomen: no tenderness or distention, no masses by palpation, no abnormal pulsatility or arterial bruits, normal bowel sounds, no hepatosplenomegaly; gravid uterus Extremities: no clubbing, cyanosis or edema; 2+ radial, ulnar and brachial pulses bilaterally; 2+ right femoral, posterior tibial and dorsalis pedis pulses; 2+ left femoral, posterior tibial and dorsalis pedis pulses; no subclavian or femoral bruits Neurological: grossly nonfocal   EKG: NSR   Lipid Panel  No results found for this basename: chol, trig, hdl, cholhdl, vldl, ldlcalc    BMET    Component Value Date/Time   NA 136 03/19/2009 0529   K 3.7 03/19/2009 0529   CL 107 03/19/2009 0529   CO2 26 03/19/2009 0529   GLUCOSE 58* 03/19/2009 0529   BUN 12 03/19/2009 0529   CREATININE 0.68 03/19/2009 0529   CALCIUM 7.9* 03/19/2009 0529   GFRNONAA >60 03/19/2009 0529   GFRAA  Value: >60        The eGFR has been calculated using the MDRD equation. This calculation has not been validated in all clinical situations. eGFR's persistently <60 mL/min signify possible Chronic Kidney Disease. 03/19/2009 0529     ASSESSMENT AND PLAN Near syncope Symptoms are highly compatible with vasovagal syncope. She has mild orthostatic hypotension. There was an approximately 10 mm of mercury drop in her blood pressure from laying to standing today. She did not have any symptoms with this. I have recommended that she stay very well hydrated and eat a relatively high sodium diet. Support stockings might be beneficial as would tilt training. I think he should try to avoid pharmacological therapy if possible since she is in the second trimester pregnancy. Also recommend an echocardiogram to exclude structural heart disease. Starch clear to me whether she just has a flow-related systolic murmur of pregnancy or whether her murmurs and eat a long-standing  issue  Orders Placed This Encounter  Procedures  . EKG 12-Lead  . 2D Echocardiogram without contrast   No orders of the defined types were placed in this encounter.    Junious Silk, MD, [facility] [phone] office [phone] pager

## 2013-07-06 ENCOUNTER — Ambulatory Visit (HOSPITAL_COMMUNITY): Payer: PRIVATE HEALTH INSURANCE

## 2013-09-11 ENCOUNTER — Encounter (HOSPITAL_COMMUNITY): Payer: Self-pay | Admitting: *Deleted

## 2013-09-11 ENCOUNTER — Inpatient Hospital Stay (HOSPITAL_COMMUNITY)
Admission: AD | Admit: 2013-09-11 | Discharge: 2013-09-12 | DRG: 778 | Disposition: A | Discharge status: Home / Self Care | Payer: 59 | Source: Ambulatory Visit | Attending: Obstetrics and Gynecology | Admitting: Obstetrics and Gynecology

## 2013-09-11 ENCOUNTER — Inpatient Hospital Stay (HOSPITAL_COMMUNITY): Payer: 59

## 2013-09-11 DIAGNOSIS — O368390 Maternal care for abnormalities of the fetal heart rate or rhythm, unspecified trimester, not applicable or unspecified: Secondary | ICD-10-CM | POA: Diagnosis present

## 2013-09-11 DIAGNOSIS — O479 False labor, unspecified: Secondary | ICD-10-CM | POA: Diagnosis present

## 2013-09-11 DIAGNOSIS — O34219 Maternal care for unspecified type scar from previous cesarean delivery: Secondary | ICD-10-CM | POA: Diagnosis present

## 2013-09-11 DIAGNOSIS — O4700 False labor before 37 completed weeks of gestation, unspecified trimester: Principal | ICD-10-CM | POA: Diagnosis present

## 2013-09-11 DIAGNOSIS — O09299 Supervision of pregnancy with other poor reproductive or obstetric history, unspecified trimester: Secondary | ICD-10-CM

## 2013-09-11 DIAGNOSIS — K219 Gastro-esophageal reflux disease without esophagitis: Secondary | ICD-10-CM | POA: Diagnosis present

## 2013-09-11 DIAGNOSIS — IMO0002 Reserved for concepts with insufficient information to code with codable children: Secondary | ICD-10-CM | POA: Diagnosis present

## 2013-09-11 DIAGNOSIS — O99891 Other specified diseases and conditions complicating pregnancy: Secondary | ICD-10-CM | POA: Diagnosis present

## 2013-09-11 LAB — WET PREP, GENITAL
Clue Cells Wet Prep HPF POC: NONE SEEN
Trich, Wet Prep: NONE SEEN
Yeast Wet Prep HPF POC: NONE SEEN

## 2013-09-11 LAB — CBC
HCT: 30.6 % — ABNORMAL LOW (ref 36.0–46.0)
MCH: 28.6 pg (ref 26.0–34.0)
MCV: 84.1 fL (ref 78.0–100.0)
Platelets: 210 10*3/uL (ref 150–400)
RBC: 3.64 MIL/uL — ABNORMAL LOW (ref 3.87–5.11)
RDW: 13.5 % (ref 11.5–15.5)
WBC: 5.3 10*3/uL (ref 4.0–10.5)

## 2013-09-11 LAB — URINE MICROSCOPIC-ADD ON

## 2013-09-11 LAB — URINALYSIS, ROUTINE W REFLEX MICROSCOPIC
Bilirubin Urine: NEGATIVE
Glucose, UA: NEGATIVE mg/dL
Hgb urine dipstick: NEGATIVE
Ketones, ur: NEGATIVE mg/dL
Protein, ur: NEGATIVE mg/dL
Urobilinogen, UA: 2 mg/dL — ABNORMAL HIGH (ref 0.0–1.0)

## 2013-09-11 LAB — TYPE AND SCREEN: ABO/RH(D): A POS

## 2013-09-11 MED ORDER — ACETAMINOPHEN 325 MG PO TABS
650.0000 mg | ORAL_TABLET | ORAL | Status: DC | PRN
  Administered 2013-09-11 – 2013-09-12 (×3): 650 mg via ORAL
  Filled 2013-09-11 (×3): qty 2

## 2013-09-11 MED ORDER — CALCIUM CARBONATE ANTACID 500 MG PO CHEW: 2.0000 | CHEWABLE_TABLET | ORAL | Status: DC | PRN

## 2013-09-11 MED ORDER — ZOLPIDEM TARTRATE 5 MG PO TABS
5.0000 mg | ORAL_TABLET | Freq: Every evening | ORAL | Status: DC | PRN
  Administered 2013-09-11: 5 mg via ORAL
  Filled 2013-09-11: qty 1

## 2013-09-11 MED ORDER — NIFEDIPINE 10 MG PO CAPS
10.0000 mg | ORAL_CAPSULE | ORAL | Status: AC | PRN
  Administered 2013-09-11 (×4): 10 mg via ORAL
  Filled 2013-09-11 (×4): qty 1

## 2013-09-11 MED ORDER — PRENATAL MULTIVITAMIN CH
1.0000 | ORAL_TABLET | Freq: Every day | ORAL | Status: DC
  Administered 2013-09-12: 1 via ORAL
  Filled 2013-09-11: qty 1

## 2013-09-11 MED ORDER — DOCUSATE SODIUM 100 MG PO CAPS
100.0000 mg | ORAL_CAPSULE | Freq: Every day | ORAL | Status: DC
  Administered 2013-09-12: 100 mg via ORAL
  Filled 2013-09-11: qty 1

## 2013-09-11 NOTE — H&P (Signed)
Alicia Bryant is a 32 y.o. female, G2P1001 at 54 5/7 weeks, presenting for admission for observation due to overall non-reactive NST and BPP 6/8.  Also had contractions on admission, with cervix closed and FFN negative.    Received flu vaccine 10/29, TDaP 08/11/13.  Patient Active Problem List   Diagnosis Date Noted  . Preterm contractions 09/12/2013  . Previous cesarean delivery, antepartum condition or complication 09/12/2013  . Hx of pre-eclampsia in prior pregnancy, currently pregnant 09/12/2013  . Hx of postpartum hemorrhage, currently pregnant 09/12/2013    History OB History   Grav Para Term Preterm Abortions TAB SAB Ect Mult Living   2 1 1       1     1999--TAB 2010--primary LTCS, induced at 37 weeks for pre-eclampsia, FTP/Non-reassuring FHR, blood transfusion pp, on BP med 2 weeks pp, 36 hour labor, progressed to 5 cm, delivered at [facility] by Dr. Ernestina Penna.  Past Medical History  Diagnosis Date  . Migraine   . Heart murmur    Past Surgical History  Procedure Laterality Date  . Cesarean section  2010  . Tonsillectomy  2004   Family History: family history includes Diabetes in her father; Hypertension in her father.  Social History:  reports that she has never smoked. She does not have any smokeless tobacco history on file. She reports that she does not drink alcohol or use illicit drugs. Husband, Naarah Borgerding, is involved and supportive.  Patient is employed as Water quality scientist.   Prenatal Transfer Tool  Maternal Diabetes: No Genetic Screening: Declined Maternal Ultrasounds/Referrals: Normal Fetal Ultrasounds or other Referrals:  None Maternal Substance Abuse:  No Significant Maternal Medications:  None Significant Maternal Lab Results:  Lab values include: Other: GBS and cultures pending from MAU visit 09/11/13 Other Comments:  None  ROS :  Contractions, +FM   Dilation: Closed Effacement (%): Thick Exam by:: Nigel Bridgeman, CNM Blood pressure 131/86, pulse  82, temperature 98.7 F (37.1 C), temperature source Oral, resp. rate 18, height 4\' 11"  (1.499 m), weight 155 lb (70.308 kg). Exam Physical Exam Chest clear Heart RRR without murmur Abd gravid, NT Pelvic--closed, long, vtx, -2 Ext WNL  FHR Category 2 overall, with some segments of Category 1--occasional mild/moderate variables, segments of negative spontaneous CST UCs initially 2-3 min, now q 4-6 min, mild]  BPP 6/8, normal AFI 15, vtx.   Prenatal labs: ABO, Rh: --/--/A POS, A POS (12/26 2050) Antibody: NEG (12/26 2050) Rubella:  Immune RPR:   NR HBsAg:   Neg HIV:   NR GBS:   Pending from admission Cultures negative at NO[facility], pending from admission today. Normal glucola  Results for orders placed during the hospital encounter of 09/11/13 (from the past 24 hour(s))  CULTURE, BETA STREP (GROUP B ONLY)     Status: None   Collection Time    09/11/13 12:25 PM      Result Value Range   Specimen Description VAGINAL/RECTAL     Special Requests Immunocompromised     Culture       Value: Culture reincubated for better growth     Performed at [facility]   Report Status PENDING    WET PREP, GENITAL     Status: Abnormal   Collection Time    09/11/13 12:25 PM      Result Value Range   Yeast Wet Prep HPF POC NONE SEEN  NONE SEEN   Trich, Wet Prep NONE SEEN  NONE SEEN   Clue Cells Wet  Prep HPF POC NONE SEEN  NONE SEEN   WBC, Wet Prep HPF POC FEW (*) NONE SEEN  FETAL FIBRONECTIN     Status: None   Collection Time    09/11/13 12:25 PM      Result Value Range   Fetal Fibronectin NEGATIVE  NEGATIVE  TYPE AND SCREEN     Status: None   Collection Time    09/11/13  8:50 PM      Result Value Range   ABO/RH(D) A POS     Antibody Screen NEG     Sample Expiration 09/14/2013    CBC     Status: Abnormal   Collection Time    09/11/13  8:50 PM      Result Value Range   WBC 5.3  4.0 - 10.5 K/uL   RBC 3.64 (*) 3.87 - 5.11 MIL/uL   Hemoglobin 10.4 (*) 12.0 - 15.0 g/dL   HCT 16.1  (*) 09.6 - 46.0 %   MCV 84.1  78.0 - 100.0 fL   MCH 28.6  26.0 - 34.0 pg   MCHC 34.0  30.0 - 36.0 g/dL   RDW 04.5  40.9 - 81.1 %   Platelets 210  150 - 400 K/uL  ABO/RH     Status: None   Collection Time    09/11/13  8:50 PM      Result Value Range   ABO/RH(D) A POS       Assessment/Plan: IUP at 33 5/7 weeks PT UC, negative FFN, cervix closed. Equivocal FHR assessment. BPP 6/8  Plan: Admit to Antental Unit per consult with Dr. Su Hilt Routine antenatal orders Continuous EFM Repeat BPP in am.  Emilee Hero, Ivan Lacher 09/11/13 7p

## 2013-09-11 NOTE — MAU Note (Signed)
Pt. Having pressure and acid reflux that began around 2:30am and didn't go away until about an hour ago.Pt. Is on protonix for acid reflux and takes this in the night. Also took Tums last night during flare up. Did have some pain or braxton hicks contractions around that same time. Denies any leakage of fluid or blood. Baby is moving well. Has noticed an increase in swelling in her hands in the past 2-3 days.

## 2013-09-11 NOTE — MAU Provider Note (Signed)
History   32 yo G3P1011 zat 33 5/7 weeks who called c/o ? Cramping since 3:30am--denies leaking or bleeding, reports +FM.  Last IC 09/09/14.    Patient Active Problem List   Diagnosis Date Noted  . Preterm contractions 09/12/2013  . Previous cesarean delivery, antepartum condition or complication 09/12/2013  . Hx of pre-eclampsia in prior pregnancy, currently pregnant 09/12/2013  . Hx of postpartum hemorrhage, currently pregnant 09/12/2013    HPI:  See above  OB History   Grav Para Term Preterm Abortions TAB SAB Ect Mult Living   3 1 1  1     1       Past Medical History  Diagnosis Date  . Migraine   . Heart murmur     Past Surgical History  Procedure Laterality Date  . Cesarean section  2010  . Tonsillectomy  2004    Family History  Problem Relation Age of Onset  . Hypertension Father   . Diabetes Father     History  Substance Use Topics  . Smoking status: Never Smoker   . Smokeless tobacco: Not on file  . Alcohol Use: No    Allergies:  Allergies  Allergen Reactions  . Banana Itching  . Shellfish Allergy     Throat itching     Prescriptions prior to admission  Medication Sig Dispense Refill  . acetaminophen (TYLENOL) 500 MG tablet Take 1,000 mg by mouth every 6 (six) hours as needed.      . pantoprazole (PROTONIX) 40 MG tablet Take 40 mg by mouth daily.      . Prenatal Vit-Fe Fumarate-FA (PRENATAL MULTIVITAMIN) TABS tablet Take 1 tablet by mouth daily at 12 noon.      . pseudoephedrine (SUDAFED) 30 MG tablet Take 30 mg by mouth every 4 (four) hours as needed for congestion.        ROS:  Contractions, +FM Physical Exam   Blood pressure 131/86, pulse 82, temperature 98.7 F (37.1 C), temperature source Oral, resp. rate 18, height 4\' 11"  (1.499 m), weight 155 lb (70.308 kg).  Physical Exam Chest clear Heart RRR without murmur Abd gravid, NT Pelvic--cervix closed, long, vtx, -2 Ext WNL  FHR Category 2--negative spontaneous CST, but no current  accels.  No decels at present. UCs q 2-3 min, mild to palpation.   ED Course  IUP at 33 5/7 weeks PT UCs Category 2 FHR  Plan: FFN done. GC, chlamydia, wet prep, GBS UA Procardia regimen prn for UCs, if no response from po hydration. Consider BPP/AFI prn if FHR does not resolve to Category 1 tracing.   Nigel Bridgeman CNM, [place] 09/12/2013 12:09 PM

## 2013-09-11 NOTE — MAU Note (Signed)
Pt. Sitting up in bed, eating. Maternal heart rate being picked up.

## 2013-09-12 ENCOUNTER — Inpatient Hospital Stay (HOSPITAL_COMMUNITY): Payer: 59

## 2013-09-12 ENCOUNTER — Encounter (HOSPITAL_COMMUNITY): Payer: Self-pay | Admitting: Obstetrics and Gynecology

## 2013-09-12 DIAGNOSIS — O09299 Supervision of pregnancy with other poor reproductive or obstetric history, unspecified trimester: Secondary | ICD-10-CM

## 2013-09-12 DIAGNOSIS — O34219 Maternal care for unspecified type scar from previous cesarean delivery: Secondary | ICD-10-CM | POA: Diagnosis present

## 2013-09-12 DIAGNOSIS — O4700 False labor before 37 completed weeks of gestation, unspecified trimester: Secondary | ICD-10-CM | POA: Diagnosis present

## 2013-09-12 DIAGNOSIS — O479 False labor, unspecified: Secondary | ICD-10-CM | POA: Diagnosis present

## 2013-09-12 MED ORDER — BUTALBITAL-APAP-CAFFEINE 50-325-40 MG PO TABS
2.0000 | ORAL_TABLET | Freq: Four times a day (QID) | ORAL | Status: DC | PRN
  Administered 2013-09-12: 2 via ORAL
  Filled 2013-09-12: qty 2

## 2013-09-12 NOTE — Discharge Summary (Signed)
Physician Discharge Summary  Patient ID: Alicia Bryant MRN: [id number] DOB/AGE: 1980-10-21 32 y.o.  Admit date: 09/11/2013 Discharge date: 09/12/2013  Admission Diagnoses: PT contractions Non-reactive FHR BPP 6/8  Discharge Diagnoses:  Active Problems:   Preterm contractions Reassuring FHR status, negative FFN  Discharged Condition: Stable, with reassuring FHR status, negative FFN, resolution of preterm contractions  Hospital Course: Presented to MAU on 09/11/13 with c/o cramping for several hours.  UCs were noted q 2-3 minutes, with negative spontaneous CST, but not clearly reactive.  Had several mild/moderate variables.  Received Procardia regimen in MAU, with some benefit in contraction pattern--patient overall became comfortable during her stay in MAU, noting very few contractions.  BPP in MAU was 6/8, with no FBM noted.  AFI was WNL, vtx presentation.  Patient was admitted to Antenatal Unit for observation overnight and repeat BPP in am.  FFN was negative--cultures and GBS were pending at the time of d/c.  FHR demonstrated Category 1 tracing overnight, with BPP 8/8 on repeat on 09/12/13.  Patient was d/c'd home per previous plan of Dr. Su Hilt.  FKCs were reviewed, and PTL precautions were discussed. She will keep her scheduled appt for 09/16/13, and will call prn.  Consults: None  Significant Diagnostic Studies: radiology: Ultrasound: BPP 8/8 on day of d/c  Treatments: None  Discharge Exam: Blood pressure 131/86, pulse 82, temperature 98.7 F (37.1 C), temperature source Oral, resp. rate 18, height 4\' 11"  (1.499 m), weight 155 lb (70.308 kg). General appearance: alert Resp: clear to auscultation bilaterally Cardio: regular rate and rhythm, S1, S2 normal, no murmur, click, rub or gallop Pelvic: cervix normal in appearance, external genitalia normal, no adnexal masses or tenderness, no cervical motion tenderness, rectovaginal septum normal, uterus normal size, shape, and  consistency and vagina normal without discharge Extremities: extremities normal, atraumatic, no cyanosis or edema FHR Category 1, UCs very occasional/mild. Cervix closed on exam 09/11/13.  Disposition: 01-Home or Self Care     Medication List         acetaminophen 500 MG tablet  Commonly known as:  TYLENOL  Take 1,000 mg by mouth every 6 (six) hours as needed.     pantoprazole 40 MG tablet  Commonly known as:  PROTONIX  Take 40 mg by mouth daily.     prenatal multivitamin Tabs tablet  Take 1 tablet by mouth daily at 12 noon.     pseudoephedrine 30 MG tablet  Commonly known as:  SUDAFED  Take 30 mg by mouth every 4 (four) hours as needed for congestion.           Follow-up Information   Follow up with [facility] On 09/16/2013. (Call for any questions or concerns.  Keep scheduled appt at [facility] on Wednesday.)    Specialty:  Obstetrics and Gynecology   Contact information:   [address] [place] [address] [phone]      Signed: Nigel Bridgeman 09/12/2013, 11:15 AM

## 2013-09-13 LAB — GC/CHLAMYDIA PROBE AMP
CT Probe RNA: NEGATIVE
GC Probe RNA: NEGATIVE

## 2013-09-13 LAB — CULTURE, BETA STREP (GROUP B ONLY)

## 2013-09-16 ENCOUNTER — Telehealth (HOSPITAL_COMMUNITY): Payer: Self-pay | Admitting: Obstetrics and Gynecology

## 2013-09-16 NOTE — Telephone Encounter (Signed)
TC from patient--expected Phenergan elixir to be called in by Eddie North, NP for N/V, which started today. Also has nasal congestion and cough, but no fever. Rx Phenergan with codeine elixir to [facility]. To f/u if any increase in sx or any other issues.

## 2013-09-18 ENCOUNTER — Inpatient Hospital Stay (HOSPITAL_COMMUNITY)
Admission: AD | Admit: 2013-09-18 | Discharge: 2013-09-18 | Disposition: A | Discharge status: Home / Self Care | Payer: 59 | Source: Ambulatory Visit | Attending: Obstetrics and Gynecology | Admitting: Obstetrics and Gynecology

## 2013-09-18 ENCOUNTER — Encounter (HOSPITAL_COMMUNITY): Payer: Self-pay | Admitting: *Deleted

## 2013-09-18 DIAGNOSIS — O34219 Maternal care for unspecified type scar from previous cesarean delivery: Secondary | ICD-10-CM | POA: Insufficient documentation

## 2013-09-18 DIAGNOSIS — R030 Elevated blood-pressure reading, without diagnosis of hypertension: Secondary | ICD-10-CM | POA: Insufficient documentation

## 2013-09-18 DIAGNOSIS — O4700 False labor before 37 completed weeks of gestation, unspecified trimester: Secondary | ICD-10-CM | POA: Insufficient documentation

## 2013-09-18 DIAGNOSIS — O99891 Other specified diseases and conditions complicating pregnancy: Secondary | ICD-10-CM | POA: Insufficient documentation

## 2013-09-18 DIAGNOSIS — O9989 Other specified diseases and conditions complicating pregnancy, childbirth and the puerperium: Principal | ICD-10-CM

## 2013-09-18 LAB — CBC
HCT: 28.5 % — ABNORMAL LOW (ref 36.0–46.0)
HEMOGLOBIN: 9.7 g/dL — AB (ref 12.0–15.0)
MCH: 28.8 pg (ref 26.0–34.0)
MCHC: 34 g/dL (ref 30.0–36.0)
MCV: 84.6 fL (ref 78.0–100.0)
Platelets: 174 10*3/uL (ref 150–400)
RBC: 3.37 MIL/uL — ABNORMAL LOW (ref 3.87–5.11)
RDW: 14 % (ref 11.5–15.5)
WBC: 4.2 10*3/uL (ref 4.0–10.5)

## 2013-09-18 LAB — URINALYSIS, ROUTINE W REFLEX MICROSCOPIC
Glucose, UA: NEGATIVE mg/dL
Hgb urine dipstick: NEGATIVE
KETONES UR: 15 mg/dL — AB
Leukocytes, UA: NEGATIVE
Nitrite: NEGATIVE
Protein, ur: 30 mg/dL — AB
Specific Gravity, Urine: 1.02 (ref 1.005–1.030)
Urobilinogen, UA: 0.2 mg/dL (ref 0.0–1.0)
pH: 7 (ref 5.0–8.0)

## 2013-09-18 LAB — COMPREHENSIVE METABOLIC PANEL
ALT: 36 U/L — ABNORMAL HIGH (ref 0–35)
AST: 38 U/L — ABNORMAL HIGH (ref 0–37)
Albumin: 2.6 g/dL — ABNORMAL LOW (ref 3.5–5.2)
Alkaline Phosphatase: 274 U/L — ABNORMAL HIGH (ref 39–117)
BUN: 7 mg/dL (ref 6–23)
CO2: 23 mEq/L (ref 19–32)
Calcium: 8.5 mg/dL (ref 8.4–10.5)
Chloride: 101 mEq/L (ref 96–112)
Creatinine, Ser: 0.45 mg/dL — ABNORMAL LOW (ref 0.50–1.10)
GFR calc Af Amer: >90 mL/min (ref >90)
GFR calc non Af Amer: >90 mL/min (ref >90)
Glucose, Bld: 70 mg/dL (ref 70–99)
Potassium: 3.2 mEq/L — ABNORMAL LOW (ref 3.7–5.3)
Sodium: 139 mEq/L (ref 137–147)
TOTAL PROTEIN: 6.2 g/dL (ref 6.0–8.3)
Total Bilirubin: 0.3 mg/dL (ref 0.3–1.2)

## 2013-09-18 LAB — URINE MICROSCOPIC-ADD ON

## 2013-09-18 LAB — LACTATE DEHYDROGENASE: LDH: 241 U/L (ref 94–250)

## 2013-09-18 LAB — URIC ACID: Uric Acid, Serum: 4.2 mg/dL (ref 2.4–7.0)

## 2013-09-18 LAB — PROTEIN / CREATININE RATIO, URINE
Creatinine, Urine: 376.46 mg/dL
Protein Creatinine Ratio: 0.13 (ref 0.00–0.15)
TOTAL PROTEIN, URINE: 50.6 mg/dL

## 2013-09-18 NOTE — MAU Provider Note (Signed)
History   33 yo G3P1011 at 86 6/7 weeks presented from the office for further BP assessment--took her BP on New Year's Day at home, with 160/78 and 170/72 with father's BP cuff.  Instructed to come to the office today for BP check--BP 150/70 and 155/60.  Denies HA, visual sx, or epigastric pain.  Reports mild contractions, denies leaking or bleeding, reports +FM.  Father's BP cuff readings were congruent with [facility] equipment.  Hx pre-eclampsia in 1st pregnancy.  Patient Active Problem List   Diagnosis Date Noted  . Preterm contractions 09/12/2013  . Previous cesarean delivery, antepartum condition or complication 09/12/2013  . Hx of pre-eclampsia in prior pregnancy, currently pregnant 09/12/2013  . Hx of postpartum hemorrhage, currently pregnant 09/12/2013     Chief Complaint  Patient presents with  . Hypertension   HPI:  See above  OB History   Grav Para Term Preterm Abortions TAB SAB Ect Mult Living   3 1 1  1     1       Past Medical History  Diagnosis Date  . Migraine   . Heart murmur     Past Surgical History  Procedure Laterality Date  . Cesarean section  2010  . Tonsillectomy  2004    Family History  Problem Relation Age of Onset  . Hypertension Father   . Diabetes Father     History  Substance Use Topics  . Smoking status: Never Smoker   . Smokeless tobacco: Never Used  . Alcohol Use: No    Allergies:  Allergies  Allergen Reactions  . Banana Itching  . Shellfish Allergy     Throat itching     Prescriptions prior to admission  Medication Sig Dispense Refill  . acetaminophen (TYLENOL) 500 MG tablet Take 1,000 mg by mouth every 6 (six) hours as needed.      . pantoprazole (PROTONIX) 40 MG tablet Take 40 mg by mouth daily.      . Prenatal Vit-Fe Fumarate-FA (PRENATAL MULTIVITAMIN) TABS tablet Take 1 tablet by mouth daily at 12 noon.        ROS:  Mild HA, occasional contractions, +FM Physical Exam   Blood pressure 159/74, pulse 64, temperature  97.9 F (36.6 C), temperature source Oral, resp. rate 20, height 4\' 11"  (1.499 m), weight 158 lb 2 oz (71.725 kg).  Filed Vitals:   09/18/13 1330 09/18/13 1345 09/18/13 1400 09/18/13 1415  BP: 165/77 165/81 156/76 159/74  Pulse:      Temp:      TempSrc:      Resp:      Height:      Weight:       BP range 135-168/70-81 in MAU  Physical Exam Chest clear Heart RRR without murmur Abd gravid, NT Pelvic--closed, long, soft, vtx, -2/-3 Ext DTR 2+ without clonus, tr edema  FHR Category 1 UCs irregular, mild  Results for orders placed during the hospital encounter of 09/18/13 (from the past 24 hour(s))  PROTEIN / CREATININE RATIO, URINE     Status: None   Collection Time    09/18/13 12:06 PM      Result Value Range   Creatinine, Urine 376.46     Total Protein, Urine 50.6     PROTEIN CREATININE RATIO 0.13  0.00 - 0.15  URINALYSIS, ROUTINE W REFLEX MICROSCOPIC     Status: Abnormal   Collection Time    09/18/13 12:08 PM      Result Value Range   Color, Urine YELLOW  YELLOW   APPearance CLEAR  CLEAR   Specific Gravity, Urine 1.020  1.005 - 1.030   pH 7.0  5.0 - 8.0   Glucose, UA NEGATIVE  NEGATIVE mg/dL   Hgb urine dipstick NEGATIVE  NEGATIVE   Bilirubin Urine SMALL (*) NEGATIVE   Ketones, ur 15 (*) NEGATIVE mg/dL   Protein, ur 30 (*) NEGATIVE mg/dL   Urobilinogen, UA 0.2  0.0 - 1.0 mg/dL   Nitrite NEGATIVE  NEGATIVE   Leukocytes, UA NEGATIVE  NEGATIVE  URINE MICROSCOPIC-ADD ON     Status: Abnormal   Collection Time    09/18/13 12:08 PM      Result Value Range   Squamous Epithelial / LPF MANY (*) RARE   WBC, UA 3-6  <3 WBC/hpf   Bacteria, UA MANY (*) RARE   Urine-Other MUCOUS PRESENT    CBC     Status: Abnormal   Collection Time    09/18/13 12:52 PM      Result Value Range   WBC 4.2  4.0 - 10.5 K/uL   RBC 3.37 (*) 3.87 - 5.11 MIL/uL   Hemoglobin 9.7 (*) 12.0 - 15.0 g/dL   HCT 16.128.5 (*) 09.636.0 - 04.546.0 %   MCV 84.6  78.0 - 100.0 fL   MCH 28.8  26.0 - 34.0 pg   MCHC  34.0  30.0 - 36.0 g/dL   RDW 40.914.0  81.111.5 - 91.415.5 %   Platelets 174  150 - 400 K/uL  COMPREHENSIVE METABOLIC PANEL     Status: Abnormal   Collection Time    09/18/13 12:52 PM      Result Value Range   Sodium 139  137 - 147 mEq/L   Potassium 3.2 (*) 3.7 - 5.3 mEq/L   Chloride 101  96 - 112 mEq/L   CO2 23  19 - 32 mEq/L   Glucose, Bld 70  70 - 99 mg/dL   BUN 7  6 - 23 mg/dL   Creatinine, Ser 7.820.45 (*) 0.50 - 1.10 mg/dL   Calcium 8.5  8.4 - 95.610.5 mg/dL   Total Protein 6.2  6.0 - 8.3 g/dL   Albumin 2.6 (*) 3.5 - 5.2 g/dL   AST 38 (*) 0 - 37 U/L   ALT 36 (*) 0 - 35 U/L   Alkaline Phosphatase 274 (*) 39 - 117 U/L   Total Bilirubin 0.3  0.3 - 1.2 mg/dL   GFR calc non Af Amer >90  >90 mL/min   GFR calc Af Amer >90  >90 mL/min  LACTATE DEHYDROGENASE     Status: None   Collection Time    09/18/13 12:52 PM      Result Value Range   LDH 241  94 - 250 U/L  URIC ACID     Status: None   Collection Time    09/18/13 12:52 PM      Result Value Range   Uric Acid, Serum 4.2  2.4 - 7.0 mg/dL   Previous [facility] labs done on 12/31 for BP 140/80, 140/76: WNL (AST/ALT 29/30)  ED Course  IUP at 34 5/7 weeks Mild systolic elevation  Plan: D/C home per consult with Dr. Normand Sloopillard. 24 hour urine--to bring to office for 1/7 visit. Add US for growth, fluid, and BPP to visit on 1/7, or change the day to allow for ROB and US to be accomplished. PIH precautions reviewed.    Nigel BridgemanLATHAM, Lacreshia Bondarenko CNM, [place] 09/18/2013 3:23 PM

## 2013-09-18 NOTE — MAU Note (Signed)
Sent in for [facility] eval.

## 2013-09-18 NOTE — Discharge Instructions (Signed)
Hypertension During Pregnancy Hypertension is also called high blood pressure. It can occur at any time in life and during pregnancy. When you have hypertension, there is extra pressure inside your blood vessels that carry blood from the heart to the rest of your body (arteries). Hypertension during pregnancy can cause problems for you and your baby. Your baby might not weigh as much as it should at birth or might be born early (premature). Very bad cases of hypertension during pregnancy can be life-threatening.  There are different types of hypertension during pregnancy.   Chronic hypertension. This happens when a woman has hypertension before pregnancy and it continues during pregnancy.  Gestational hypertension. This is when hypertension develops during pregnancy.  Preeclampsia or toxemia of pregnancy. This is a very serious type of hypertension that develops only during pregnancy. It is a disease that affects the whole body (systemic) and can be very dangerous for both mother and baby.  Gestational hypertension and preeclampsia usually go away after your baby is born. Blood pressure generally stabilizes within 6 weeks. Women who have hypertension during pregnancy have a greater chance of developing hypertension later in life or with future pregnancies. UNDERSTANDING BLOOD PRESSURE Blood pressure moves blood in your body. Sometimes, the force that moves the blood becomes too strong.  A blood pressure reading is given in 2 numbers and looks like a fraction.  The top number is called the systolic pressure. When your heart beats, it forces more blood to flow through the arteries. Pressure inside the arteries goes up.  The bottom number is the diastolic pressure. Pressure goes down between beats. That is when the heart is resting.  You may have hypertension if:  Your systolic blood pressure is above 140.  Your diastolic pressure is above 90. RISK FACTORS Some factors make you more likely to  develop hypertension during pregnancy. Risk factors include:  Having hypertension before pregnancy.  Having hypertension during a previous pregnancy.  Being overweight.  Being older than 8.  Being pregnant with more than 1 baby (multiples).  Having diabetes or kidney problems. SYMPTOMS Chronic and gestational hypertension may not cause symptoms. Preeclampsia has symptoms, which may include:  Increased protein in your urine. Your caregiver will check for this at every prenatal visit.  Swelling of your hands and face.  Rapid weight gain.  Headaches.  Visual changes.  Being bothered by light.  Abdominal pain, especially in the right upper area.  Chest pain.  Shortness of breath.  Increased reflexes.  Seizures. Seizures occur with a more severe form of preeclampsia, called eclampsia. DIAGNOSIS   You may be diagnosed with hypertension during pregnancy during a regular prenatal exam. At each visit, tests may include:  Blood pressure checks.  A urine test to check for protein in your urine.  The type of hypertension you are diagnosed with depends on when you developed it. It also depends on your specific blood pressure reading.  Developing hypertension before 20 weeks of pregnancy is consistent with chronic hypertension.  Developing hypertension after 20 weeks of pregnancy is consistent with gestational hypertension.  Hypertension with increased urinary protein is diagnosed as preeclampsia.  Blood pressure measurements that stay above 045 systolic or 409 diastolic are a sign of severe preeclampsia. TREATMENT Treatment for hypertension during pregnancy varies. Treatment depends on the type of hypertension and how serious it is.  If you take medicine for chronic hypertension, you may need to switch medicines.  Drugs called ACE inhibitors should not be taken during pregnancy.  Low-dose aspirin may be suggested for women who have risk factors for preeclampsia.  If  you have gestational hypertension, you may need to take a blood pressure medicine that is safe during pregnancy. Your caregiver will recommend the appropriate medicine.  If you have severe preeclampsia, you may need to be in the hospital. Caregivers will watch you and the baby very closely. You also may need to take medicine (magnesium sulfate) to prevent seizures and lower blood pressure.  Sometimes an early delivery is needed. This may be the case if the condition worsens. It would be done to protect you and the baby. The only cure for preeclampsia is delivery. HOME CARE INSTRUCTIONS  Schedule and keep all of your regular prenatal care.  Follow your caregiver's instructions for taking medicines. Tell your caregiver about all medicines you take. This includes over-the-counter medicines.  Eat as little salt as possible.  Get regular exercise.  Do not drink alcohol.  Do not use tobacco products.  Do not drink products with caffeine.  Lie on your left side when resting.  Tell your doctor if you have any preeclampsia symptoms. SEEK IMMEDIATE MEDICAL CARE IF:  You have severe abdominal pain.  You have sudden swelling in the hands, ankles, or face.  You gain 4 pounds (1.8 kg) or more in 1 week.  You vomit repeatedly.  You have vaginal bleeding.  You do not feel the baby moving as much.  You have a headache.  You have blurred or double vision.  You have muscle twitching or spasms.  You have shortness of breath.  You have blue fingernails and lips.  You have blood in your urine. MAKE SURE YOU:  Understand these instructions.  Will watch your condition.  Will get help right away if you are not doing well. Document Released: 05/22/2011 Document Revised: 11/26/2011 Document Reviewed: 05/22/2011 [employer] Patient Information 2014 [employer], [place].  24-Hour Urine Collection HOME CARE  When you get up in the morning on the day you do this test, pee (urinate) in the  toilet and flush. Make a note of the time. This will be your start time on the day of collection and the end time on the next morning.  From then on, save all your pee (urine) in the plastic jug that was given to you.  You should stop collecting your pee 24 hours after you started.  If the plastic jug that is given to you already has liquid in it, that is okay. Do not throw out the liquid or rinse out the jug. Some tests need the liquid to be added to your pee.  Keep your plastic jug cool (in an ice chest or the refrigerator) during the test.  When the 24 hours is over, bring your plastic jug to the clinic lab. Keep the jug cool (in an ice chest) while you are bringing it to the lab. Document Released: 11/30/2008 Document Revised: 11/26/2011 Document Reviewed: 11/30/2008 [facility] Patient Information 2014 [place], [place].

## 2013-09-19 LAB — URINE CULTURE: Colony Count: 15000

## 2013-09-29 ENCOUNTER — Inpatient Hospital Stay (HOSPITAL_COMMUNITY)
Admission: AD | Admit: 2013-09-29 | Discharge: 2013-10-07 | DRG: 765 | Disposition: A | Discharge status: Home / Self Care | Payer: 59 | Source: Ambulatory Visit | Attending: Obstetrics and Gynecology | Admitting: Obstetrics and Gynecology

## 2013-09-29 ENCOUNTER — Encounter (HOSPITAL_COMMUNITY): Payer: Self-pay | Admitting: *Deleted

## 2013-09-29 DIAGNOSIS — O09299 Supervision of pregnancy with other poor reproductive or obstetric history, unspecified trimester: Secondary | ICD-10-CM

## 2013-09-29 DIAGNOSIS — O479 False labor, unspecified: Secondary | ICD-10-CM

## 2013-09-29 DIAGNOSIS — O4700 False labor before 37 completed weeks of gestation, unspecified trimester: Secondary | ICD-10-CM

## 2013-09-29 DIAGNOSIS — O9902 Anemia complicating childbirth: Secondary | ICD-10-CM | POA: Diagnosis present

## 2013-09-29 DIAGNOSIS — O34219 Maternal care for unspecified type scar from previous cesarean delivery: Secondary | ICD-10-CM

## 2013-09-29 DIAGNOSIS — O139 Gestational [pregnancy-induced] hypertension without significant proteinuria, unspecified trimester: Principal | ICD-10-CM | POA: Diagnosis present

## 2013-09-29 DIAGNOSIS — Z98891 History of uterine scar from previous surgery: Secondary | ICD-10-CM

## 2013-09-29 DIAGNOSIS — D649 Anemia, unspecified: Secondary | ICD-10-CM | POA: Diagnosis present

## 2013-09-29 HISTORY — DX: Gestational (pregnancy-induced) hypertension without significant proteinuria, unspecified trimester: O13.9

## 2013-09-29 HISTORY — DX: Encounter for other specified aftercare: Z51.89

## 2013-09-29 LAB — COMPREHENSIVE METABOLIC PANEL
ALBUMIN: 2.9 g/dL — AB (ref 3.5–5.2)
ALT: 12 U/L (ref 0–35)
AST: 20 U/L (ref 0–37)
Alkaline Phosphatase: 248 U/L — ABNORMAL HIGH (ref 39–117)
BUN: 7 mg/dL (ref 6–23)
CALCIUM: 9 mg/dL (ref 8.4–10.5)
CO2: 22 mEq/L (ref 19–32)
CREATININE: 0.42 mg/dL — AB (ref 0.50–1.10)
Chloride: 101 mEq/L (ref 96–112)
GFR calc Af Amer: >90 mL/min (ref >90)
GFR calc non Af Amer: >90 mL/min (ref >90)
Glucose, Bld: 65 mg/dL — ABNORMAL LOW (ref 70–99)
Potassium: 3.1 mEq/L — ABNORMAL LOW (ref 3.7–5.3)
Sodium: 137 mEq/L (ref 137–147)
TOTAL PROTEIN: 6.7 g/dL (ref 6.0–8.3)
Total Bilirubin: 0.3 mg/dL (ref 0.3–1.2)

## 2013-09-29 LAB — CBC
HEMATOCRIT: 29.6 % — AB (ref 36.0–46.0)
Hemoglobin: 9.9 g/dL — ABNORMAL LOW (ref 12.0–15.0)
MCH: 28.4 pg (ref 26.0–34.0)
MCHC: 33.4 g/dL (ref 30.0–36.0)
MCV: 84.8 fL (ref 78.0–100.0)
Platelets: 235 10*3/uL (ref 150–400)
RBC: 3.49 MIL/uL — ABNORMAL LOW (ref 3.87–5.11)
RDW: 14 % (ref 11.5–15.5)
WBC: 4.9 10*3/uL (ref 4.0–10.5)

## 2013-09-29 LAB — URINALYSIS, ROUTINE W REFLEX MICROSCOPIC
Bilirubin Urine: NEGATIVE
Glucose, UA: NEGATIVE mg/dL
Hgb urine dipstick: NEGATIVE
KETONES UR: NEGATIVE mg/dL
Nitrite: NEGATIVE
PH: 6.5 (ref 5.0–8.0)
Protein, ur: NEGATIVE mg/dL
Specific Gravity, Urine: <1.005 — ABNORMAL LOW (ref 1.005–1.030)
Urobilinogen, UA: 0.2 mg/dL (ref 0.0–1.0)

## 2013-09-29 LAB — URINE MICROSCOPIC-ADD ON

## 2013-09-29 LAB — PROTEIN / CREATININE RATIO, URINE: CREATININE, URINE: 27.28 mg/dL

## 2013-09-29 LAB — LACTATE DEHYDROGENASE: LDH: 225 U/L (ref 94–250)

## 2013-09-29 LAB — URIC ACID: URIC ACID, SERUM: 3.9 mg/dL (ref 2.4–7.0)

## 2013-09-29 MED ORDER — SODIUM CHLORIDE 0.9 % IJ SOLN: 3.0000 mL | INTRAMUSCULAR | Status: DC | PRN

## 2013-09-29 MED ORDER — ACETAMINOPHEN 325 MG PO TABS
650.0000 mg | ORAL_TABLET | Freq: Once | ORAL | Status: AC
  Administered 2013-09-29: 650 mg via ORAL
  Filled 2013-09-29: qty 2

## 2013-09-29 MED ORDER — SODIUM CHLORIDE 0.9 % IV SOLN
250.0000 mL | INTRAVENOUS | Status: DC | PRN
Start: 2013-09-29 — End: 2013-10-04

## 2013-09-29 MED ORDER — ACETAMINOPHEN 325 MG PO TABS
650.0000 mg | ORAL_TABLET | ORAL | Status: DC | PRN
  Administered 2013-09-30 – 2013-10-02 (×6): 650 mg via ORAL
  Filled 2013-09-29 (×6): qty 2

## 2013-09-29 MED ORDER — CALCIUM CARBONATE ANTACID 500 MG PO CHEW: 2.0000 | CHEWABLE_TABLET | ORAL | Status: DC | PRN

## 2013-09-29 MED ORDER — DOCUSATE SODIUM 100 MG PO CAPS
100.0000 mg | ORAL_CAPSULE | Freq: Every day | ORAL | Status: DC
  Administered 2013-09-29 – 2013-10-03 (×5): 100 mg via ORAL
  Filled 2013-09-29 (×5): qty 1

## 2013-09-29 MED ORDER — PANTOPRAZOLE SODIUM 40 MG PO TBEC
40.0000 mg | DELAYED_RELEASE_TABLET | Freq: Every day | ORAL | Status: DC
  Administered 2013-09-29 – 2013-10-03 (×5): 40 mg via ORAL
  Filled 2013-09-29 (×6): qty 1

## 2013-09-29 MED ORDER — ZOLPIDEM TARTRATE 5 MG PO TABS
5.0000 mg | ORAL_TABLET | Freq: Every evening | ORAL | Status: DC | PRN
  Administered 2013-09-29 – 2013-09-30 (×2): 5 mg via ORAL
  Filled 2013-09-29 (×2): qty 1

## 2013-09-29 MED ORDER — LABETALOL HCL 5 MG/ML IV SOLN
10.0000 mg | INTRAVENOUS | Status: DC | PRN
  Administered 2013-09-30 – 2013-10-02 (×2): 10 mg via INTRAVENOUS
  Filled 2013-09-29 (×2): qty 4

## 2013-09-29 MED ORDER — SODIUM CHLORIDE 0.9 % IJ SOLN
3.0000 mL | Freq: Two times a day (BID) | INTRAMUSCULAR | Status: DC
  Administered 2013-09-29 – 2013-10-03 (×9): 3 mL via INTRAVENOUS

## 2013-09-29 MED ORDER — LABETALOL HCL 200 MG PO TABS
200.0000 mg | ORAL_TABLET | Freq: Three times a day (TID) | ORAL | Status: DC
  Administered 2013-09-29 (×2): 200 mg via ORAL
  Filled 2013-09-29: qty 1
  Filled 2013-09-29: qty 2
  Filled 2013-09-29: qty 1

## 2013-09-29 MED ORDER — PRENATAL MULTIVITAMIN CH
1.0000 | ORAL_TABLET | Freq: Every day | ORAL | Status: DC
  Administered 2013-09-30 – 2013-10-03 (×4): 1 via ORAL
  Filled 2013-09-29 (×4): qty 1

## 2013-09-29 NOTE — H&P (Signed)
  Alicia Bryant is 04/06/1981  No LMP recorded. Patient is pregnant. 5261w2d  Pt presents with 36WKS,BP  Pregnancy complicated by H/o CS PIH no meds PMHX  Past Medical History  Diagnosis Date  . Migraine   . Heart murmur   . Pregnancy induced hypertension     PSURG HX  Past Surgical History  Procedure Laterality Date  . Cesarean section  2010  . Tonsillectomy  2004    Fam HX  Family History  Problem Relation Age of Onset  . Hypertension Father   . Diabetes Father     Soc HX  History   Social History  . Marital Status: Married    Spouse Name: N/A    Number of Children: N/A  . Years of Education: N/A   Occupational History  . Not on file.   Social History Main Topics  . Smoking status: Never Smoker   . Smokeless tobacco: Never Used  . Alcohol Use: No  . Drug Use: No  . Sexual Activity: Yes    Birth Control/ Protection: None   Other Topics Concern  . Not on file   Social History Narrative  . No narrative on file    ROS  Review of Systems - Genito-Urinary ROS: no dysuria, trouble voiding, or hematuria Neurological ROS: positive for - visual changes  US SIUP vtx position.  AFInormal   EFW6-3   63% on 1/7  Gen WDWN  IN NAD CV RRR LUNGS CTAB ABD Gravid soft NT GU closed/40/-3 EXT no calf tenderness B.  2 plus DTR B A/P: IUP 2461w2d PIH Admit to antepartum Start labetalol Labs stable PCR pending.   If unable to control BPS or preeclampsia would consider delivery Pt with H/O CS.  Does desire to VBAC.  May tr low dose pitocin or foley bulb Category 1 with occ ctx

## 2013-09-29 NOTE — MAU Note (Addendum)
They have been watching BP, didn't feel right at work- checked BP - it was up, so went to doctor.  Sent over from office for (?176/104) further eval. HA- just on rt side; blurring- seeing dk spots on rt side; denies epigastric pain. Slight increase in swelling- worse on left side.

## 2013-09-30 LAB — COMPREHENSIVE METABOLIC PANEL
ALBUMIN: 2.5 g/dL — AB (ref 3.5–5.2)
ALT: 13 U/L (ref 0–35)
AST: 20 U/L (ref 0–37)
Alkaline Phosphatase: 233 U/L — ABNORMAL HIGH (ref 39–117)
BUN: 8 mg/dL (ref 6–23)
CALCIUM: 8.8 mg/dL (ref 8.4–10.5)
CO2: 23 meq/L (ref 19–32)
CREATININE: 0.42 mg/dL — AB (ref 0.50–1.10)
Chloride: 102 mEq/L (ref 96–112)
GFR calc Af Amer: >90 mL/min (ref >90)
Glucose, Bld: 76 mg/dL (ref 70–99)
Potassium: 3.1 mEq/L — ABNORMAL LOW (ref 3.7–5.3)
SODIUM: 138 meq/L (ref 137–147)
Total Bilirubin: 0.2 mg/dL — ABNORMAL LOW (ref 0.3–1.2)
Total Protein: 5.7 g/dL — ABNORMAL LOW (ref 6.0–8.3)

## 2013-09-30 LAB — CBC WITH DIFFERENTIAL/PLATELET
BASOS ABS: 0 10*3/uL (ref 0.0–0.1)
Basophils Relative: 0 % (ref 0–1)
EOS ABS: 0.1 10*3/uL (ref 0.0–0.7)
EOS PCT: 1 % (ref 0–5)
HCT: 26.9 % — ABNORMAL LOW (ref 36.0–46.0)
Hemoglobin: 8.9 g/dL — ABNORMAL LOW (ref 12.0–15.0)
LYMPHS ABS: 0.9 10*3/uL (ref 0.7–4.0)
Lymphocytes Relative: 20 % (ref 12–46)
MCH: 28.3 pg (ref 26.0–34.0)
MCHC: 33.1 g/dL (ref 30.0–36.0)
MCV: 85.4 fL (ref 78.0–100.0)
Monocytes Absolute: 0.5 10*3/uL (ref 0.1–1.0)
Monocytes Relative: 11 % (ref 3–12)
NEUTROS PCT: 68 % (ref 43–77)
Neutro Abs: 2.9 10*3/uL (ref 1.7–7.7)
PLATELETS: 218 10*3/uL (ref 150–400)
RBC: 3.15 MIL/uL — ABNORMAL LOW (ref 3.87–5.11)
RDW: 13.9 % (ref 11.5–15.5)
WBC: 4.3 10*3/uL (ref 4.0–10.5)

## 2013-09-30 LAB — TYPE AND SCREEN
ABO/RH(D): A POS
ANTIBODY SCREEN: NEGATIVE

## 2013-09-30 LAB — PROTEIN / CREATININE RATIO, URINE
CREATININE, URINE: 93.39 mg/dL
Protein Creatinine Ratio: 0.09 (ref 0.00–0.15)
TOTAL PROTEIN, URINE: 8.4 mg/dL

## 2013-09-30 LAB — URIC ACID: Uric Acid, Serum: 4.5 mg/dL (ref 2.4–7.0)

## 2013-09-30 LAB — LACTATE DEHYDROGENASE: LDH: 204 U/L (ref 94–250)

## 2013-09-30 MED ORDER — LABETALOL HCL 300 MG PO TABS
600.0000 mg | ORAL_TABLET | Freq: Two times a day (BID) | ORAL | Status: DC
  Administered 2013-09-30 – 2013-10-01 (×2): 600 mg via ORAL
  Filled 2013-09-30 (×4): qty 2

## 2013-09-30 MED ORDER — FERROUS SULFATE 325 (65 FE) MG PO TABS
325.0000 mg | ORAL_TABLET | Freq: Every day | ORAL | Status: DC
  Administered 2013-10-01 – 2013-10-03 (×3): 325 mg via ORAL
  Filled 2013-09-30 (×3): qty 1

## 2013-09-30 MED ORDER — LABETALOL HCL 300 MG PO TABS
600.0000 mg | ORAL_TABLET | ORAL | Status: AC
  Administered 2013-09-30: 600 mg via ORAL
  Filled 2013-09-30: qty 2

## 2013-09-30 MED ORDER — LABETALOL HCL 200 MG PO TABS
200.0000 mg | ORAL_TABLET | Freq: Three times a day (TID) | ORAL | Status: DC
  Administered 2013-09-30: 200 mg via ORAL
  Filled 2013-09-30 (×4): qty 1

## 2013-09-30 NOTE — Progress Notes (Signed)
Pt up to shower then transferred to room 151 due to BR being not cleaned

## 2013-09-30 NOTE — Progress Notes (Signed)
Ur chart review completed.  

## 2013-09-30 NOTE — Progress Notes (Signed)
Patient ID: Alicia Bryant, female   DOB: 11/19/1980, 33 y.o.   MRN: [id number]  Alicia Bryant is a 33 y.o. G3P1011 at 3585w3d admitted for gest htn eval for preeclampsia  Subjective: Denies HA, visual changes or abdominal pain.  Denies ctxs, lof, vb and reports +FM.  Objective: BP 152/79  Pulse 75  Temp(Src) 98.2 F (36.8 C) (Oral)  Resp 20  Ht 4\' 11"  (1.499 m)  Wt 70.988 kg (156 lb 8 oz)  BMI 31.59 kg/m2 I/O last 3 completed shifts: In: 1300 [P.O.:1300] Out: 1000 [Urine:1000] Total I/O In: 1680 [P.O.:1680] Out: 1400 [Urine:1400]  Physical Exam:  Gen: alert Chest/Lungs: cta bilaterally  Heart/Pulse: RRR  Abdomen: soft, gravid, nontender Uterine fundus: soft, nontender Skin & Color: warm and dry  EXT: negative Homan's b/l, no calf tenderness  FHT:  FHR: 130s bpm, variability: moderate,  accelerations:  Present,  decelerations:  Absent UC:   none SVE:   Dilation: Fingertip Effacement (%): 50 Station: -3 Exam by:: Dr Normand Sloopillard  Hgb 8.9 from 9.9 All other labs wnl  Labs: Lab Results  Component Value Date   WBC 4.3 09/30/2013   HGB 8.9* 09/30/2013   HCT 26.9* 09/30/2013   MCV 85.4 09/30/2013   PLT 218 09/30/2013    Assessment and Plan: has Preterm contractions; Previous cesarean delivery, antepartum condition or complication; Hx of pre-eclampsia in prior pregnancy, currently pregnant; Hx of postpartum hemorrhage, currently pregnant; and PIH (pregnancy induced hypertension) on her problem list.  CHTN with neg w/u for preeclampsia BP continue to be elevated requiring IV labetalol. Will increase BP med to 600mg  BID Fetal status is overall reassuring If BPs cont to be elevated, I rec starting procardia xl 30mg  po qd as well Daily labs Asymptomatic anemia fe qd  Alicia Bryant Y 09/30/2013, 5:37 PM

## 2013-10-01 LAB — CBC
HEMATOCRIT: 26.1 % — AB (ref 36.0–46.0)
HEMOGLOBIN: 8.9 g/dL — AB (ref 12.0–15.0)
MCH: 28.9 pg (ref 26.0–34.0)
MCHC: 34.1 g/dL (ref 30.0–36.0)
MCV: 84.7 fL (ref 78.0–100.0)
Platelets: 216 10*3/uL (ref 150–400)
RBC: 3.08 MIL/uL — ABNORMAL LOW (ref 3.87–5.11)
RDW: 14.1 % (ref 11.5–15.5)
WBC: 4.5 10*3/uL (ref 4.0–10.5)

## 2013-10-01 LAB — COMPREHENSIVE METABOLIC PANEL
ALK PHOS: 222 U/L — AB (ref 39–117)
ALT: 14 U/L (ref 0–35)
AST: 23 U/L (ref 0–37)
Albumin: 2.6 g/dL — ABNORMAL LOW (ref 3.5–5.2)
BUN: 8 mg/dL (ref 6–23)
CO2: 23 mEq/L (ref 19–32)
Calcium: 8.6 mg/dL (ref 8.4–10.5)
Chloride: 103 mEq/L (ref 96–112)
Creatinine, Ser: 0.48 mg/dL — ABNORMAL LOW (ref 0.50–1.10)
GFR calc non Af Amer: >90 mL/min (ref >90)
Glucose, Bld: 83 mg/dL (ref 70–99)
POTASSIUM: 3.1 meq/L — AB (ref 3.7–5.3)
Sodium: 137 mEq/L (ref 137–147)
TOTAL PROTEIN: 6 g/dL (ref 6.0–8.3)
Total Bilirubin: 0.2 mg/dL — ABNORMAL LOW (ref 0.3–1.2)

## 2013-10-01 LAB — PROTEIN / CREATININE RATIO, URINE
CREATININE, URINE: 64.83 mg/dL
Protein Creatinine Ratio: 0.15 (ref 0.00–0.15)
TOTAL PROTEIN, URINE: 9.5 mg/dL

## 2013-10-01 LAB — LACTATE DEHYDROGENASE: LDH: 293 U/L — ABNORMAL HIGH (ref 94–250)

## 2013-10-01 LAB — URIC ACID: URIC ACID, SERUM: 4.4 mg/dL (ref 2.4–7.0)

## 2013-10-01 MED ORDER — DIPHENHYDRAMINE HCL 25 MG PO CAPS
50.0000 mg | ORAL_CAPSULE | Freq: Every evening | ORAL | Status: DC | PRN
  Administered 2013-10-01 – 2013-10-03 (×2): 50 mg via ORAL
  Filled 2013-10-01: qty 2

## 2013-10-01 MED ORDER — NIFEDIPINE ER 30 MG PO TB24
30.0000 mg | ORAL_TABLET | Freq: Every day | ORAL | Status: DC
  Administered 2013-10-01 – 2013-10-03 (×3): 30 mg via ORAL
  Filled 2013-10-01 (×4): qty 1

## 2013-10-01 MED ORDER — LABETALOL HCL 300 MG PO TABS
600.0000 mg | ORAL_TABLET | Freq: Three times a day (TID) | ORAL | Status: DC
  Administered 2013-10-01 – 2013-10-04 (×8): 600 mg via ORAL
  Filled 2013-10-01 (×8): qty 2

## 2013-10-01 MED ORDER — NIFEDIPINE ER 30 MG PO TB24: 30.0000 mg | ORAL_TABLET | Freq: Every day | ORAL | Status: DC

## 2013-10-01 MED ORDER — LABETALOL HCL 300 MG PO TABS: 600.0000 mg | ORAL_TABLET | Freq: Two times a day (BID) | ORAL | Status: DC

## 2013-10-01 MED ORDER — DIPHENHYDRAMINE HCL 25 MG PO CAPS
50.0000 mg | ORAL_CAPSULE | Freq: Every evening | ORAL | Status: DC | PRN
  Filled 2013-10-01: qty 2

## 2013-10-01 MED ORDER — LABETALOL HCL 300 MG PO TABS
600.0000 mg | ORAL_TABLET | Freq: Once | ORAL | Status: AC
  Administered 2013-10-01: 600 mg via ORAL
  Filled 2013-10-01: qty 2

## 2013-10-01 MED ORDER — GUAIFENESIN 100 MG/5ML PO SYRP
200.0000 mg | ORAL_SOLUTION | ORAL | Status: DC | PRN
  Administered 2013-10-01: 200 mg via ORAL
  Filled 2013-10-01 (×2): qty 10

## 2013-10-01 MED ORDER — SALINE SPRAY 0.65 % NA SOLN
1.0000 | NASAL | Status: DC | PRN
  Administered 2013-10-01: 1 via NASAL
  Filled 2013-10-01: qty 44

## 2013-10-01 NOTE — Progress Notes (Signed)
Sinus congestion more prominent over forehead and cheeks.  Pt complaining of sinus headache resistant to tylenol and some nasal bleeding on blowing nose.  [facility] for NS nasal spray.

## 2013-10-01 NOTE — Progress Notes (Signed)
Pt taking herself off the monitor and to the bathroom for a shower.  Spouse in the room, pt voices no needs at this time

## 2013-10-01 NOTE — Progress Notes (Signed)
Pt returning from from the shower and placed back on the monitor

## 2013-10-01 NOTE — Progress Notes (Signed)
Orders received and discharge cancelled

## 2013-10-01 NOTE — Progress Notes (Signed)
TC to [facility] CN[facility] to report pt's complaint of dull Headache frontal and pressure behind right eye.  Decongestant contraindicated.  Order for Robitussin given to drainage.

## 2013-10-01 NOTE — Progress Notes (Signed)
Monitor off

## 2013-10-02 ENCOUNTER — Inpatient Hospital Stay (HOSPITAL_COMMUNITY): Payer: 59

## 2013-10-02 LAB — COMPREHENSIVE METABOLIC PANEL
ALBUMIN: 2.5 g/dL — AB (ref 3.5–5.2)
ALT: 15 U/L (ref 0–35)
AST: 20 U/L (ref 0–37)
Alkaline Phosphatase: 224 U/L — ABNORMAL HIGH (ref 39–117)
BUN: 7 mg/dL (ref 6–23)
CALCIUM: 8.9 mg/dL (ref 8.4–10.5)
CO2: 22 mEq/L (ref 19–32)
Chloride: 105 mEq/L (ref 96–112)
Creatinine, Ser: 0.42 mg/dL — ABNORMAL LOW (ref 0.50–1.10)
GFR calc non Af Amer: >90 mL/min (ref >90)
GLUCOSE: 73 mg/dL (ref 70–99)
POTASSIUM: 3.2 meq/L — AB (ref 3.7–5.3)
Sodium: 141 mEq/L (ref 137–147)
Total Bilirubin: <0.2 mg/dL — ABNORMAL LOW (ref 0.3–1.2)
Total Protein: 5.6 g/dL — ABNORMAL LOW (ref 6.0–8.3)

## 2013-10-02 LAB — CBC
HEMATOCRIT: 26.6 % — AB (ref 36.0–46.0)
HEMOGLOBIN: 9 g/dL — AB (ref 12.0–15.0)
MCH: 28.7 pg (ref 26.0–34.0)
MCHC: 33.8 g/dL (ref 30.0–36.0)
MCV: 84.7 fL (ref 78.0–100.0)
Platelets: 207 10*3/uL (ref 150–400)
RBC: 3.14 MIL/uL — AB (ref 3.87–5.11)
RDW: 14.2 % (ref 11.5–15.5)
WBC: 4.3 10*3/uL (ref 4.0–10.5)

## 2013-10-02 LAB — LACTATE DEHYDROGENASE: LDH: 188 U/L (ref 94–250)

## 2013-10-02 LAB — URIC ACID: URIC ACID, SERUM: 4.3 mg/dL (ref 2.4–7.0)

## 2013-10-02 MED ORDER — ONDANSETRON 4 MG PO TBDP
4.0000 mg | ORAL_TABLET | Freq: Once | ORAL | Status: AC
  Administered 2013-10-02: 4 mg via ORAL
  Filled 2013-10-02: qty 1

## 2013-10-02 MED ORDER — BUTORPHANOL TARTRATE 1 MG/ML IJ SOLN
1.0000 mg | Freq: Once | INTRAMUSCULAR | Status: AC
  Administered 2013-10-02: 1 mg via INTRAVENOUS
  Filled 2013-10-02: qty 1

## 2013-10-02 MED ORDER — OXYCODONE HCL 5 MG PO TABS
5.0000 mg | ORAL_TABLET | ORAL | Status: DC | PRN
  Administered 2013-10-02: 5 mg via ORAL
  Filled 2013-10-02: qty 1

## 2013-10-02 MED ORDER — LACTATED RINGERS IV BOLUS (SEPSIS)
300.0000 mL | Freq: Once | INTRAVENOUS | Status: AC
  Administered 2013-10-02: 300 mL via INTRAVENOUS

## 2013-10-02 MED ORDER — METOCLOPRAMIDE HCL 5 MG/ML IJ SOLN
10.0000 mg | Freq: Once | INTRAMUSCULAR | Status: AC
  Administered 2013-10-02: 10 mg via INTRAVENOUS
  Filled 2013-10-02: qty 2

## 2013-10-02 NOTE — Progress Notes (Signed)
Called to bathroom where pt c/o having dizziness and spots and very lightheaded in the shower.  Pt assisted to the bed to lie doen, Alicia BridgemanVicki Latham CNM in to see pt at that time and discussing with her BP meds and effects.Pt starting to feel better lying in the bed

## 2013-10-02 NOTE — Progress Notes (Signed)
Call received regarding patient having likely migraine.  History positive for migraines and feels that oxycodone made her nauseated.   Filed Vitals:   10/02/13 1123 10/02/13 1335 10/02/13 1517 10/02/13 1719  BP: 157/74 167/85 183/85 154/80  Pulse: 90 80 72 78  Temp: 98.4 F (36.9 C)     TempSrc:      Resp: 20     Height:      Weight:       Will implement migraine treatment as follows: -IV hydration -Reglan 10mg  IV -Stadol 1mg  IV  If headache remains unresponsive to treatment, will consult with physician.   Nigel BridgemanVicki Muadh Bryant, CNM  [phone]

## 2013-10-02 NOTE — Progress Notes (Signed)
Pt vomitting after returning from u/s, had become very nauseated.

## 2013-10-02 NOTE — Progress Notes (Signed)
Unrelieved H/A

## 2013-10-02 NOTE — Progress Notes (Signed)
Patient ID: Alicia Bryant, female   DOB: 11/06/1980, 33 y.o.   MRN: [id number] CTSP regarding plan of care.  The patient wanted to know the plan.  I explained she has PIH and not preeclampsia.  We are increasing her meds to help control her blood pressures. If we reach the maximum dose and her blood pressures are still not controlled we would proceed with delivery.  The patient and her husband agree and understand the plan.  She also wants benadryl for sleep.  Pt states that the Remus Lofflerambien is  Not working.

## 2013-10-02 NOTE — Progress Notes (Signed)
Pt was about to be discharged home because BPs had been stable all morning at 140s/80s. However RN notifies me of elevated BP at 4p.  I instructed her to repeat BP and if still elevated call SR because pt might have to stay  Cat 1 tracing No ctxs Labs that morning wnl and repeat urine PCR neg  A/P Cont to obs BPs If BPs remain elevated, pt will have to remain in hosp for BP control and meds adjusted more.

## 2013-10-02 NOTE — Progress Notes (Signed)
Hospital day # 3 pregnancy at [redacted]w[redacted]d--Gestational HTN.  S:  Doing well, reports good fetal activity Had one incident of dizziness while in the shower. B/P 130/60      Perception of contractions: none      Vaginal bleeding: None       Vaginal discharge:  no significant change  O: BP 132/58  Pulse 93  Temp(Src) 98.4 F (36.9 C) (Oral)  Resp 18  Ht 4\' 11"  (1.499 m)  Wt 155 lb 5 oz (70.449 kg)  BMI 31.35 kg/m2  Filed Vitals:   10/02/13 0459 10/02/13 0642 10/02/13 0802 10/02/13 1005  BP: 149/79 140/69 131/71 132/58  Pulse: 90 82 99 93  Temp:  98 F (36.7 C) 98.4 F (36.9 C)   TempSrc:  Oral Oral   Resp:  20 18   Height:      Weight:             Fetal tracings: Reactive Segments with Occ. Quick variable      Contractions:  Irregular mild      Uterus non-tender      Extremities: no significant edema and no signs of DVT          Labs:   Results for orders placed during the hospital encounter of 09/29/13 (from the past 24 hour(s))  LACTATE DEHYDROGENASE     Status: None   Collection Time    10/02/13  5:22 AM      Result Value Range   LDH 188  94 - 250 U/L  COMPREHENSIVE METABOLIC PANEL     Status: Abnormal   Collection Time    10/02/13  5:22 AM      Result Value Range   Sodium 141  137 - 147 mEq/L   Potassium 3.2 (*) 3.7 - 5.3 mEq/L   Chloride 105  96 - 112 mEq/L   CO2 22  19 - 32 mEq/L   Glucose, Bld 73  70 - 99 mg/dL   BUN 7  6 - 23 mg/dL   Creatinine, Ser 4.09 (*) 0.50 - 1.10 mg/dL   Calcium 8.9  8.4 - 81.1 mg/dL   Total Protein 5.6 (*) 6.0 - 8.3 g/dL   Albumin 2.5 (*) 3.5 - 5.2 g/dL   AST 20  0 - 37 U/L   ALT 15  0 - 35 U/L   Alkaline Phosphatase 224 (*) 39 - 117 U/L   Total Bilirubin <0.2 (*) 0.3 - 1.2 mg/dL   GFR calc non Af Amer >90  >90 mL/min   GFR calc Af Amer >90  >90 mL/min  URIC ACID     Status: None   Collection Time    10/02/13  5:22 AM      Result Value Range   Uric Acid, Serum 4.3  2.4 - 7.0 mg/dL  CBC     Status: Abnormal   Collection Time     10/02/13  5:22 AM      Result Value Range   WBC 4.3  4.0 - 10.5 K/uL   RBC 3.14 (*) 3.87 - 5.11 MIL/uL   Hemoglobin 9.0 (*) 12.0 - 15.0 g/dL   HCT 91.4 (*) 78.2 - 95.6 %   MCV 84.7  78.0 - 100.0 fL   MCH 28.7  26.0 - 34.0 pg   MCHC 33.8  30.0 - 36.0 g/dL   RDW 21.3  08.6 - 57.8 %   Platelets 207  150 - 400 K/uL  Meds: Labetolol 600 TID, Procardia XL 30mg  daily  A: 6947w5d with Gestational HTN     stable  P: Continue current plan of care      Upcoming tests/treatments:  US today with BPP and AFI Reviewed current POC, patient considering RCS Observe B/P today and if improved MDs will consider d/c      MDs will follow  Nigel BridgemanLATHAM, Lysander Calixte CNM, [place] 10/02/2013 11:17 AM

## 2013-10-03 LAB — COMPREHENSIVE METABOLIC PANEL
ALK PHOS: 236 U/L — AB (ref 39–117)
ALT: 16 U/L (ref 0–35)
AST: 24 U/L (ref 0–37)
Albumin: 2.7 g/dL — ABNORMAL LOW (ref 3.5–5.2)
BILIRUBIN TOTAL: 0.2 mg/dL — AB (ref 0.3–1.2)
BUN: 7 mg/dL (ref 6–23)
CO2: 23 mEq/L (ref 19–32)
Calcium: 9.2 mg/dL (ref 8.4–10.5)
Chloride: 104 mEq/L (ref 96–112)
Creatinine, Ser: 0.43 mg/dL — ABNORMAL LOW (ref 0.50–1.10)
GFR calc non Af Amer: >90 mL/min (ref >90)
GLUCOSE: 73 mg/dL (ref 70–99)
POTASSIUM: 3.6 meq/L — AB (ref 3.7–5.3)
Sodium: 140 mEq/L (ref 137–147)
TOTAL PROTEIN: 6.3 g/dL (ref 6.0–8.3)

## 2013-10-03 LAB — CBC
HEMATOCRIT: 28.5 % — AB (ref 36.0–46.0)
Hemoglobin: 9.4 g/dL — ABNORMAL LOW (ref 12.0–15.0)
MCH: 28.3 pg (ref 26.0–34.0)
MCHC: 33 g/dL (ref 30.0–36.0)
MCV: 85.8 fL (ref 78.0–100.0)
PLATELETS: 227 10*3/uL (ref 150–400)
RBC: 3.32 MIL/uL — ABNORMAL LOW (ref 3.87–5.11)
RDW: 14.4 % (ref 11.5–15.5)
WBC: 5.5 10*3/uL (ref 4.0–10.5)

## 2013-10-03 LAB — URIC ACID: Uric Acid, Serum: 3.9 mg/dL (ref 2.4–7.0)

## 2013-10-03 LAB — LACTATE DEHYDROGENASE: LDH: 217 U/L (ref 94–250)

## 2013-10-03 MED ORDER — CEFAZOLIN SODIUM-DEXTROSE 2-3 GM-% IV SOLR
2.0000 g | INTRAVENOUS | Status: AC
  Administered 2013-10-04: 2 g via INTRAVENOUS
  Filled 2013-10-03: qty 50

## 2013-10-03 NOTE — Progress Notes (Signed)
36 wks and 6 days with gestational hypertension     Subjective: Pt denies headache blurred vision or ruq pain. She has contractions that are mild. She is no longer interested in VBAC and desires repeat cesarean section. She was check by RN this am and  Cervix was  Closed. Patient requred labetalol 10 mg IV at 8 pm last night for by 188/88.  She had episode of nausea and emesis after taking percocet. Elevated bp followed after her episode of nausea  Objective: Vital signs in last 24 hours: Temp:  [98.3 F (36.8 C)-98.5 F (36.9 C)] 98.3 F (36.8 C) (01/17 0741) Pulse Rate:  [66-87] 87 (01/17 0741) Resp:  [18] 18 (01/17 0543) BP: (140-188)/(70-88) 168/88 mmHg (01/17 1510) Weight:  [70.353 kg (155 lb 1.6 oz)] 70.353 kg (155 lb 1.6 oz) (01/17 1337)    Intake/Output from previous day: 01/16 0701 - 01/17 0700 In: 600 [P.O.:600] Out: 1250 [Urine:1250] Intake/Output this shift:    General Alert and oriented no acute distress ' GI abdomen is gravid and nontender  Extremities no edema 1+ reflexes   Lab Results:   Recent Labs  10/02/13 0522 10/03/13 0555  WBC 4.3 5.5  HGB 9.0* 9.4*  HCT 26.6* 28.5*  PLT 207 227   BMET  Recent Labs  10/02/13 0522 10/03/13 0555  NA 141 140  K 3.2* 3.6*  CL 105 104  CO2 22 23  GLUCOSE 73 73  BUN 7 7  CREATININE 0.42* 0.43*  CALCIUM 8.9 9.2   PT/INR No results found for this basename: LABPROT, INR,  in the last 72 hours ABG No results found for this basename: PHART, PCO2, PO2, HCO3,  in the last 72 hours  Studies/Results: [place] Ob Limited  10/02/2013   OBSTETRICAL ULTRASOUND: This exam was performed within a [facility]. The OB US report was generated in the AS system, and faxed to the ordering physician.   This report is also available in [facility] [employer] and in the [facility] PACS. See AS Obstetric US report.  [place] Fetal Bpp W/o Non Stress  10/02/2013   OBSTETRICAL ULTRASOUND: This exam was performed  within a  Ultrasound Department. The OB US report was generated in the AS system, and faxed to the ordering physician.   This report is also available in [facility] [employer] and in the [facility] PACS. See AS Obstetric US report.   Anti-infectives: Anti-infectives   None      Assessment/Plan: 36 wks and 6 days with gestational hypertension ho cesarean section   Pt was discussed with Dr. Otho PerlNitsche with Maternal Fetal Medicine. He recommends delivery at [redacted] wks EGA  or sooner if patient requires IV treatment for severe range bp. Recommendation were discussed with the patient as well as r/o cesarean section including but not limited to infection / bleeding damage to bowel bladder and baby with the need for further surgery. R/o transfusion HIV/ Hep B&C discussed. Pt voiced understanding and is in agreement with proceeding with repeat cesarean section tomorrow at 37 wks.  NPO after Midnight .   LOS: 4 days    Micheal Murad J. 10/03/2013

## 2013-10-04 ENCOUNTER — Inpatient Hospital Stay (HOSPITAL_COMMUNITY): Payer: 59 | Admitting: Anesthesiology

## 2013-10-04 ENCOUNTER — Encounter (HOSPITAL_COMMUNITY): Payer: Self-pay | Admitting: Certified Registered"

## 2013-10-04 ENCOUNTER — Encounter (HOSPITAL_COMMUNITY)
Admission: AD | Disposition: A | Discharge status: Home / Self Care | Payer: Self-pay | Source: Ambulatory Visit | Attending: Obstetrics and Gynecology

## 2013-10-04 ENCOUNTER — Encounter (HOSPITAL_COMMUNITY): Payer: 59 | Admitting: Anesthesiology

## 2013-10-04 LAB — CBC
HCT: 28.1 % — ABNORMAL LOW (ref 36.0–46.0)
HEMOGLOBIN: 9.3 g/dL — AB (ref 12.0–15.0)
MCH: 28.3 pg (ref 26.0–34.0)
MCHC: 33.1 g/dL (ref 30.0–36.0)
MCV: 85.4 fL (ref 78.0–100.0)
Platelets: 220 10*3/uL (ref 150–400)
RBC: 3.29 MIL/uL — AB (ref 3.87–5.11)
RDW: 14.5 % (ref 11.5–15.5)
WBC: 6.3 10*3/uL (ref 4.0–10.5)

## 2013-10-04 LAB — COMPREHENSIVE METABOLIC PANEL
ALBUMIN: 2.6 g/dL — AB (ref 3.5–5.2)
ALT: 20 U/L (ref 0–35)
AST: 26 U/L (ref 0–37)
Alkaline Phosphatase: 231 U/L — ABNORMAL HIGH (ref 39–117)
BUN: 8 mg/dL (ref 6–23)
CALCIUM: 8.9 mg/dL (ref 8.4–10.5)
CO2: 23 mEq/L (ref 19–32)
Chloride: 105 mEq/L (ref 96–112)
Creatinine, Ser: 0.44 mg/dL — ABNORMAL LOW (ref 0.50–1.10)
GFR calc Af Amer: >90 mL/min (ref >90)
GFR calc non Af Amer: >90 mL/min (ref >90)
Glucose, Bld: 70 mg/dL (ref 70–99)
Potassium: 3.2 mEq/L — ABNORMAL LOW (ref 3.7–5.3)
SODIUM: 141 meq/L (ref 137–147)
TOTAL PROTEIN: 5.9 g/dL — AB (ref 6.0–8.3)
Total Bilirubin: 0.3 mg/dL (ref 0.3–1.2)

## 2013-10-04 LAB — LACTATE DEHYDROGENASE: LDH: 181 U/L (ref 94–250)

## 2013-10-04 LAB — PREPARE RBC (CROSSMATCH)

## 2013-10-04 LAB — MRSA PCR SCREENING: MRSA BY PCR: NEGATIVE

## 2013-10-04 LAB — URIC ACID: URIC ACID, SERUM: 4.4 mg/dL (ref 2.4–7.0)

## 2013-10-04 SURGERY — Surgical Case
Anesthesia: Spinal | Site: Abdomen

## 2013-10-04 MED ORDER — MAGNESIUM SULFATE 40 G IN LACTATED RINGERS - SIMPLE
1.0000 g/h | INTRAVENOUS | Status: AC
  Administered 2013-10-04: 1 g/h via INTRAVENOUS
  Filled 2013-10-04: qty 500

## 2013-10-04 MED ORDER — KETOROLAC TROMETHAMINE 30 MG/ML IJ SOLN
INTRAMUSCULAR | Status: AC
  Filled 2013-10-04: qty 1

## 2013-10-04 MED ORDER — ONDANSETRON HCL 4 MG/2ML IJ SOLN
INTRAMUSCULAR | Status: AC
  Filled 2013-10-04: qty 2

## 2013-10-04 MED ORDER — PRENATAL MULTIVITAMIN CH
1.0000 | ORAL_TABLET | Freq: Every day | ORAL | Status: DC
  Administered 2013-10-05 – 2013-10-07 (×3): 1 via ORAL
  Filled 2013-10-04 (×3): qty 1

## 2013-10-04 MED ORDER — CEFAZOLIN SODIUM-DEXTROSE 2-3 GM-% IV SOLR
INTRAVENOUS | Status: DC | PRN
  Administered 2013-10-04: 2 g via INTRAVENOUS

## 2013-10-04 MED ORDER — LACTATED RINGERS IV SOLN
INTRAVENOUS | Status: AC
  Administered 2013-10-05: 04:00:00 via INTRAVENOUS

## 2013-10-04 MED ORDER — FENTANYL CITRATE 0.05 MG/ML IJ SOLN
INTRAMUSCULAR | Status: AC
  Filled 2013-10-04: qty 2

## 2013-10-04 MED ORDER — OXYTOCIN 10 UNIT/ML IJ SOLN
INTRAMUSCULAR | Status: AC
  Filled 2013-10-04: qty 1

## 2013-10-04 MED ORDER — CITRIC ACID-SODIUM CITRATE 334-500 MG/5ML PO SOLN
ORAL | Status: AC
  Administered 2013-10-04: 30 mL
  Filled 2013-10-04: qty 15

## 2013-10-04 MED ORDER — ONDANSETRON HCL 4 MG/2ML IJ SOLN: 4.0000 mg | Freq: Three times a day (TID) | INTRAMUSCULAR | Status: DC | PRN

## 2013-10-04 MED ORDER — MEPERIDINE HCL 25 MG/ML IJ SOLN: 6.2500 mg | INTRAMUSCULAR | Status: DC | PRN

## 2013-10-04 MED ORDER — SENNOSIDES-DOCUSATE SODIUM 8.6-50 MG PO TABS
2.0000 | ORAL_TABLET | ORAL | Status: DC
  Administered 2013-10-05 – 2013-10-07 (×3): 2 via ORAL
  Filled 2013-10-04 (×3): qty 2

## 2013-10-04 MED ORDER — ONDANSETRON HCL 4 MG/2ML IJ SOLN: 4.0000 mg | INTRAMUSCULAR | Status: DC | PRN

## 2013-10-04 MED ORDER — FENTANYL CITRATE 0.05 MG/ML IJ SOLN
INTRAMUSCULAR | Status: DC | PRN
  Administered 2013-10-04: 25 ug via INTRAVENOUS
  Administered 2013-10-04: 25 ug via INTRATHECAL
  Administered 2013-10-04: 25 ug via INTRAVENOUS

## 2013-10-04 MED ORDER — HYDRALAZINE HCL 20 MG/ML IJ SOLN
5.0000 mg | Freq: Once | INTRAMUSCULAR | Status: AC
  Administered 2013-10-04: 5 mg via INTRAVENOUS
  Filled 2013-10-04: qty 1

## 2013-10-04 MED ORDER — DIPHENHYDRAMINE HCL 50 MG/ML IJ SOLN
25.0000 mg | INTRAMUSCULAR | Status: DC | PRN
Start: 2013-10-04 — End: 2013-10-07

## 2013-10-04 MED ORDER — OXYTOCIN 40 UNITS IN LACTATED RINGERS INFUSION - SIMPLE MED
INTRAVENOUS | Status: DC | PRN
  Administered 2013-10-04: 40 [IU] via INTRAVENOUS

## 2013-10-04 MED ORDER — NALBUPHINE HCL 10 MG/ML IJ SOLN
5.0000 mg | INTRAMUSCULAR | Status: DC | PRN
  Filled 2013-10-04: qty 1

## 2013-10-04 MED ORDER — WITCH HAZEL-GLYCERIN EX PADS: 1.0000 "application " | MEDICATED_PAD | CUTANEOUS | Status: DC | PRN

## 2013-10-04 MED ORDER — MIDAZOLAM HCL 2 MG/2ML IJ SOLN: 0.5000 mg | Freq: Once | INTRAMUSCULAR | Status: DC | PRN

## 2013-10-04 MED ORDER — OXYCODONE-ACETAMINOPHEN 5-325 MG PO TABS
1.0000 | ORAL_TABLET | ORAL | Status: DC | PRN
  Administered 2013-10-05: 1 via ORAL
  Administered 2013-10-05 – 2013-10-06 (×2): 2 via ORAL
  Administered 2013-10-06: 1 via ORAL
  Filled 2013-10-04: qty 1
  Filled 2013-10-04 (×2): qty 2
  Filled 2013-10-04: qty 1

## 2013-10-04 MED ORDER — PHENYLEPHRINE 8 MG IN D5W 100 ML (0.08MG/ML) PREMIX OPTIME
INJECTION | INTRAVENOUS | Status: AC
  Filled 2013-10-04: qty 100

## 2013-10-04 MED ORDER — SCOPOLAMINE 1 MG/3DAYS TD PT72
1.0000 | MEDICATED_PATCH | Freq: Once | TRANSDERMAL | Status: AC
  Administered 2013-10-04: 1.5 mg via TRANSDERMAL

## 2013-10-04 MED ORDER — MORPHINE SULFATE 0.5 MG/ML IJ SOLN
INTRAMUSCULAR | Status: AC
  Filled 2013-10-04: qty 10

## 2013-10-04 MED ORDER — SIMETHICONE 80 MG PO CHEW
80.0000 mg | CHEWABLE_TABLET | ORAL | Status: DC | PRN
  Filled 2013-10-04 (×2): qty 1

## 2013-10-04 MED ORDER — NALOXONE HCL 1 MG/ML IJ SOLN
1.0000 ug/kg/h | INTRAVENOUS | Status: DC | PRN
  Filled 2013-10-04: qty 2

## 2013-10-04 MED ORDER — ONDANSETRON HCL 4 MG/2ML IJ SOLN
INTRAMUSCULAR | Status: DC | PRN
  Administered 2013-10-04: 4 mg via INTRAVENOUS

## 2013-10-04 MED ORDER — MORPHINE SULFATE (PF) 0.5 MG/ML IJ SOLN
INTRAMUSCULAR | Status: DC | PRN
  Administered 2013-10-04: .15 mg via INTRATHECAL

## 2013-10-04 MED ORDER — KETOROLAC TROMETHAMINE 30 MG/ML IJ SOLN: 30.0000 mg | Freq: Four times a day (QID) | INTRAMUSCULAR | Status: AC | PRN

## 2013-10-04 MED ORDER — SIMETHICONE 80 MG PO CHEW
80.0000 mg | CHEWABLE_TABLET | Freq: Three times a day (TID) | ORAL | Status: DC
  Administered 2013-10-04 – 2013-10-07 (×8): 80 mg via ORAL
  Filled 2013-10-04 (×9): qty 1

## 2013-10-04 MED ORDER — PHENYLEPHRINE HCL 10 MG/ML IJ SOLN
20.0000 mg | INTRAVENOUS | Status: DC | PRN
  Administered 2013-10-04: 10 ug/min via INTRAVENOUS

## 2013-10-04 MED ORDER — SODIUM CHLORIDE 0.9 % IJ SOLN: 3.0000 mL | INTRAMUSCULAR | Status: DC | PRN

## 2013-10-04 MED ORDER — PROMETHAZINE HCL 25 MG/ML IJ SOLN: 6.2500 mg | INTRAMUSCULAR | Status: DC | PRN

## 2013-10-04 MED ORDER — IBUPROFEN 600 MG PO TABS: 600.0000 mg | ORAL_TABLET | Freq: Four times a day (QID) | ORAL | Status: DC | PRN

## 2013-10-04 MED ORDER — OXYTOCIN 40 UNITS IN LACTATED RINGERS INFUSION - SIMPLE MED
62.5000 mL/h | INTRAVENOUS | Status: AC
  Administered 2013-10-04: 62.5 mL/h via INTRAVENOUS
  Filled 2013-10-04: qty 1000

## 2013-10-04 MED ORDER — MENTHOL 3 MG MT LOZG: 1.0000 | LOZENGE | OROMUCOSAL | Status: DC | PRN

## 2013-10-04 MED ORDER — FENTANYL CITRATE 0.05 MG/ML IJ SOLN
25.0000 ug | INTRAMUSCULAR | Status: DC | PRN
  Administered 2013-10-04 (×4): 50 ug via INTRAVENOUS

## 2013-10-04 MED ORDER — ZOLPIDEM TARTRATE 5 MG PO TABS: 5.0000 mg | ORAL_TABLET | Freq: Every evening | ORAL | Status: DC | PRN

## 2013-10-04 MED ORDER — DIPHENHYDRAMINE HCL 25 MG PO CAPS
25.0000 mg | ORAL_CAPSULE | ORAL | Status: DC | PRN
  Administered 2013-10-04 – 2013-10-07 (×2): 25 mg via ORAL
  Filled 2013-10-04 (×2): qty 1

## 2013-10-04 MED ORDER — IBUPROFEN 600 MG PO TABS
600.0000 mg | ORAL_TABLET | Freq: Four times a day (QID) | ORAL | Status: DC
  Administered 2013-10-04 – 2013-10-07 (×12): 600 mg via ORAL
  Filled 2013-10-04 (×12): qty 1

## 2013-10-04 MED ORDER — KETOROLAC TROMETHAMINE 30 MG/ML IJ SOLN
30.0000 mg | Freq: Four times a day (QID) | INTRAMUSCULAR | Status: AC | PRN
  Administered 2013-10-04: 30 mg via INTRAVENOUS

## 2013-10-04 MED ORDER — NALOXONE HCL 0.4 MG/ML IJ SOLN: 0.4000 mg | INTRAMUSCULAR | Status: DC | PRN

## 2013-10-04 MED ORDER — GLYCOPYRROLATE 0.2 MG/ML IJ SOLN
INTRAMUSCULAR | Status: AC
  Filled 2013-10-04: qty 2

## 2013-10-04 MED ORDER — DIBUCAINE 1 % RE OINT: 1.0000 "application " | TOPICAL_OINTMENT | RECTAL | Status: DC | PRN

## 2013-10-04 MED ORDER — SCOPOLAMINE 1 MG/3DAYS TD PT72
MEDICATED_PATCH | TRANSDERMAL | Status: AC
  Filled 2013-10-04: qty 1

## 2013-10-04 MED ORDER — ONDANSETRON HCL 4 MG PO TABS: 4.0000 mg | ORAL_TABLET | ORAL | Status: DC | PRN

## 2013-10-04 MED ORDER — ACETAMINOPHEN 500 MG PO TABS
1000.0000 mg | ORAL_TABLET | Freq: Four times a day (QID) | ORAL | Status: AC
  Administered 2013-10-04 – 2013-10-05 (×3): 1000 mg via ORAL
  Filled 2013-10-04 (×3): qty 2

## 2013-10-04 MED ORDER — TETANUS-DIPHTH-ACELL PERTUSSIS 5-2.5-18.5 LF-MCG/0.5 IM SUSP: 0.5000 mL | Freq: Once | INTRAMUSCULAR | Status: DC

## 2013-10-04 MED ORDER — DIPHENHYDRAMINE HCL 50 MG/ML IJ SOLN: 12.5000 mg | INTRAMUSCULAR | Status: DC | PRN

## 2013-10-04 MED ORDER — LANOLIN HYDROUS EX OINT: 1.0000 "application " | TOPICAL_OINTMENT | CUTANEOUS | Status: DC | PRN

## 2013-10-04 MED ORDER — HYDRALAZINE HCL 20 MG/ML IJ SOLN: 5.0000 mg | Freq: Once | INTRAMUSCULAR | Status: DC

## 2013-10-04 MED ORDER — SIMETHICONE 80 MG PO CHEW
80.0000 mg | CHEWABLE_TABLET | ORAL | Status: DC
  Administered 2013-10-05 – 2013-10-07 (×4): 80 mg via ORAL
  Filled 2013-10-04: qty 1

## 2013-10-04 MED ORDER — DIPHENHYDRAMINE HCL 25 MG PO CAPS: 25.0000 mg | ORAL_CAPSULE | Freq: Four times a day (QID) | ORAL | Status: DC | PRN

## 2013-10-04 MED ORDER — METOCLOPRAMIDE HCL 5 MG/ML IJ SOLN: 10.0000 mg | Freq: Three times a day (TID) | INTRAMUSCULAR | Status: DC | PRN

## 2013-10-04 MED ORDER — PANTOPRAZOLE SODIUM 40 MG PO TBEC
40.0000 mg | DELAYED_RELEASE_TABLET | Freq: Every day | ORAL | Status: DC
  Administered 2013-10-05 – 2013-10-06 (×3): 40 mg via ORAL
  Filled 2013-10-04 (×5): qty 1

## 2013-10-04 MED ORDER — LABETALOL HCL 300 MG PO TABS
600.0000 mg | ORAL_TABLET | Freq: Three times a day (TID) | ORAL | Status: DC
  Administered 2013-10-04 – 2013-10-05 (×3): 600 mg via ORAL
  Filled 2013-10-04 (×4): qty 2

## 2013-10-04 MED ORDER — LACTATED RINGERS IV SOLN
INTRAVENOUS | Status: DC | PRN
  Administered 2013-10-04: 08:00:00 via INTRAVENOUS

## 2013-10-04 SURGICAL SUPPLY — 50 items
APL SKNCLS STERI-STRIP NONHPOA (GAUZE/BANDAGES/DRESSINGS) ×1
BARRIER ADHS 3X4 INTERCEED (GAUZE/BANDAGES/DRESSINGS) ×2 IMPLANT
BENZOIN TINCTURE PRP APPL 2/3 (GAUZE/BANDAGES/DRESSINGS) ×3 IMPLANT
BRR ADH 4X3 ABS CNTRL BYND (GAUZE/BANDAGES/DRESSINGS) ×1
CHLORAPREP W/TINT 26ML (MISCELLANEOUS) ×2 IMPLANT
CLAMP CORD UMBIL (MISCELLANEOUS) IMPLANT
CLOSURE WOUND 1/2 X4 (GAUZE/BANDAGES/DRESSINGS) ×1
CLOTH BEACON ORANGE TIMEOUT ST (SAFETY) ×3 IMPLANT
CONTAINER PREFILL 10% NBF 15ML (MISCELLANEOUS) IMPLANT
DRAPE LG THREE QUARTER DISP (DRAPES) IMPLANT
DRSG OPSITE POSTOP 4X10 (GAUZE/BANDAGES/DRESSINGS) ×3 IMPLANT
DURAPREP 26ML APPLICATOR (WOUND CARE) ×1 IMPLANT
ELECT REM PT RETURN 9FT ADLT (ELECTROSURGICAL) ×3
ELECTRODE REM PT RTRN 9FT ADLT (ELECTROSURGICAL) ×1 IMPLANT
EXTRACTOR VACUUM M CUP 4 TUBE (SUCTIONS) IMPLANT
EXTRACTOR VACUUM M CUP 4' TUBE (SUCTIONS)
GLOVE BIOGEL M 6.5 STRL (GLOVE) ×8 IMPLANT
GLOVE BIOGEL PI IND STRL 6.5 (GLOVE) ×1 IMPLANT
GLOVE BIOGEL PI IND STRL 7.0 (GLOVE) IMPLANT
GLOVE BIOGEL PI INDICATOR 6.5 (GLOVE) ×8
GLOVE BIOGEL PI INDICATOR 7.0 (GLOVE) ×4
GLOVE SKINSENSE NS SZ6.5 (GLOVE) ×2
GLOVE SKINSENSE STRL SZ6.5 (GLOVE) IMPLANT
GOWN PREVENTION PLUS XLARGE (GOWN DISPOSABLE) ×9 IMPLANT
GOWN STRL REIN XL XLG (GOWN DISPOSABLE) ×6 IMPLANT
KIT ABG SYR 3ML LUER SLIP (SYRINGE) IMPLANT
NDL HYPO 25X5/8 SAFETYGLIDE (NEEDLE) IMPLANT
NEEDLE HYPO 25X5/8 SAFETYGLIDE (NEEDLE) IMPLANT
NS IRRIG 1000ML POUR BTL (IV SOLUTION) ×3 IMPLANT
PACK C SECTION WH (CUSTOM PROCEDURE TRAY) ×3 IMPLANT
PAD ABD 7.5X8 STRL (GAUZE/BANDAGES/DRESSINGS) ×2 IMPLANT
PAD OB MATERNITY 4.3X12.25 (PERSONAL CARE ITEMS) ×3 IMPLANT
RTRCTR C-SECT PINK 25CM LRG (MISCELLANEOUS) IMPLANT
RTRCTR C-SECT PINK 34CM XLRG (MISCELLANEOUS) IMPLANT
SCRUB PCMX 4 OZ (MISCELLANEOUS) ×2 IMPLANT
STAPLER VISISTAT 35W (STAPLE) IMPLANT
STRIP CLOSURE SKIN 1/2X4 (GAUZE/BANDAGES/DRESSINGS) ×2 IMPLANT
SUT PDS AB 0 CT1 27 (SUTURE) ×6 IMPLANT
SUT PLAIN 0 NONE (SUTURE) IMPLANT
SUT VIC AB 0 CTX 36 (SUTURE) ×9
SUT VIC AB 0 CTX36XBRD ANBCTRL (SUTURE) ×3 IMPLANT
SUT VIC AB 2-0 CT1 27 (SUTURE) ×3
SUT VIC AB 2-0 CT1 TAPERPNT 27 (SUTURE) ×1 IMPLANT
SUT VIC AB 3-0 SH 27 (SUTURE)
SUT VIC AB 3-0 SH 27X BRD (SUTURE) IMPLANT
SUT VIC AB 4-0 KS 27 (SUTURE) ×3 IMPLANT
TAPE HYPAFIX 4 X10 (GAUZE/BANDAGES/DRESSINGS) ×2 IMPLANT
TOWEL OR 17X24 6PK STRL BLUE (TOWEL DISPOSABLE) ×3 IMPLANT
TRAY FOLEY CATH 14FR (SET/KITS/TRAYS/PACK) ×3 IMPLANT
WATER STERILE IRR 1000ML POUR (IV SOLUTION) ×1 IMPLANT

## 2013-10-04 NOTE — Anesthesia Preprocedure Evaluation (Addendum)
Anesthesia Evaluation  Patient identified by MRN, date of birth, ID band Patient awake    Reviewed: Allergy & Precautions, H&P , NPO status , Patient's Chart, lab work & pertinent test results  Airway Mallampati: II       Dental   Pulmonary  breath sounds clear to auscultation        Cardiovascular Exercise Tolerance: Good hypertension, Rhythm:regular Rate:Normal     Neuro/Psych    GI/Hepatic   Endo/Other    Renal/GU      Musculoskeletal   Abdominal   Peds  Hematology   Anesthesia Other Findings   Reproductive/Obstetrics (+) Pregnancy                             Anesthesia Physical Anesthesia Plan  ASA: III and emergent  Anesthesia Plan: Spinal   Post-op Pain Management:    Induction:   Airway Management Planned:   Additional Equipment:   Intra-op Plan:   Post-operative Plan:   Informed Consent: I have reviewed the patients History and Physical, chart, labs and discussed the procedure including the risks, benefits and alternatives for the proposed anesthesia with the patient or authorized representative who has indicated his/her understanding and acceptance.     Plan Discussed with: Anesthesiologist, CRNA and Surgeon  Anesthesia Plan Comments:         Anesthesia Quick Evaluation  

## 2013-10-04 NOTE — H&P (Signed)
Past Surgical History  Procedure Laterality Date  . Cesarean section  2010  . Tonsillectomy  2004  Date of Initial H&P:10/04/2013  History reviewed, patient examined, no change in status, stable for surgery.

## 2013-10-04 NOTE — Progress Notes (Signed)
10/04/13 1213  Vitals  BP ! 167/100 mmHg  MAP (mmHg) 118  Pulse Rate 65  Oxygen Therapy  SpO2 99 %  Dr. Brayton Elzon aware of repeat elevated BP. See new order.

## 2013-10-04 NOTE — Op Note (Signed)
Cesarean Section Procedure Note  Indications: 37 wks 0 days with gestational hypertension and severe range blood pressures with h/o cesarean section   Pre-operative Diagnosis: 37 week 0 day pregnancy. 2 severe gesational hypertension 3. H/o cesarean section.   Post-operative Diagnosis: same  Surgeon: Jessee AversOLE,Steele Ledonne J.   Assistants: Dr. Katha HammingJenny Ozan   Anesthesia: Spinal anesthesia  ASA Class: 2   Procedure Details   The patient was seen in the Holding Room. The risks, benefits, complications, treatment options, and expected outcomes were discussed with the patient.  The patient concurred with the proposed plan, giving informed consent.  The site of surgery properly noted/marked. The patient was taken to Operating Room # 9, identified as Alicia Bryant and the procedure verified as C-Section Delivery. A Time Out was held and the above information confirmed.  After induction of anesthesia, the patient was draped and prepped in the usual sterile manner. A Pfannenstiel incision was made and carried down through the subcutaneous tissue to the fascia. Fascial incision was made and extended transversely. The fascia was separated from the underlying rectus tissue superiorly and inferiorly. The peritoneum was identified and entered. Peritoneal incision was extended longitudinally. The utero-vesical peritoneal reflection was incised transversely and the bladder flap was bluntly freed from the lower uterine segment. A low transverse uterine incision was made. Delivered from cephalic presentation was a Female with Apgar scores of 8 at one minute and 9 at five minutes. After the umbilical cord was clamped and cut cord blood was obtained for evaluation. The placenta was removed intact and appeared normal. The uterine outline, tubes and ovaries appeared normal. The uterine incision was closed with running locked sutures of 0 vicryl a second layer of suture was used to imbricate the incision . Hemostasis was observed.  Lavage was carried out until clear. The fascia was then reapproximated with running sutures of 0 pds. The skin was reapproximated with 4-0 vicryl .  Instrument, sponge, and needle counts were correct prior the abdominal closure and at the conclusion of the case.   Findings: Female infant in the cephalic presentation with moderate meconium. Normal fallopian tubes and ovaries.  Estimated Blood Loss:  500 ml          Drains: foley catheter UOP 200ml          Total IV Fluids:  Per anesthesia ml         Specimens: Placenta and Disposition:  Sent to Pathology          Implants: None         Complications:  None; patient tolerated the procedure well.         Disposition: PACU - hemodynamically stable.         Condition: unstable  Attending Attestation: I performed the procedure.

## 2013-10-04 NOTE — Anesthesia Postprocedure Evaluation (Signed)
  Anesthesia Post-op Note  Patient: Alicia Bryant  Procedure(s) Performed: Procedure(s): CESAREAN SECTION (N/A)  Patient is awake, responsive, moving her legs, and has signs of resolution of her numbness. Pain and nausea are reasonably well controlled. Vital signs are stable and clinically acceptable. Oxygen saturation is clinically acceptable. There are no apparent anesthetic complications at this time. Patient is ready for discharge.

## 2013-10-04 NOTE — Progress Notes (Signed)
10/04/13 1253  Vitals  BP ! 168/85 mmHg  MAP (mmHg) 107  Pulse Rate 84  Oxygen Therapy  SpO2 99 %  Dr. Charlotta Newtonzan notified. Will repeat Hydralazine IV.

## 2013-10-04 NOTE — Progress Notes (Addendum)
10/04/13 1500  Vitals  BP ! 142/78 mmHg  MAP (mmHg) 94  Pulse Rate 84  Steward Sedation Scale  Consciousness 2  Airway 2  Movement 2  Total Score 6  Oxygen Therapy  SpO2 99 %  Talked with Dr. Charlotta Newtonzan re: recent BP's with just one 5mg  dose of IV Hydaralazine(Will hold 2nd dose). Discussed scheduled 600mg  Labetalol po q8h.  Will give scheduled meds as ordered.

## 2013-10-04 NOTE — Transfer of Care (Signed)
Immediate Anesthesia Transfer of Care Note  Patient: Alicia Bryant  Procedure(s) Performed: Procedure(s): CESAREAN SECTION (N/A)  Patient Location: PACU  Anesthesia Type:Spinal  Level of Consciousness: awake, alert  and oriented  Airway & Oxygen Therapy: Patient Spontanous Breathing  Post-op Assessment: Report given to PACU RN and Post -op Vital signs reviewed and stable  Post vital signs: Reviewed and stable  Complications: No apparent anesthesia complications

## 2013-10-04 NOTE — Lactation Note (Signed)
This note was copied from the chart of Alicia Berline Choughomika Otani. Lactation Consultation Note  Patient Name: Alicia Bryant ZOXWR'UToday's Date: 10/04/2013 Reason for consult: Initial assessment;Late preterm infant;Other (Comment) (mom in AICU).  This is mom's second child.  Older son is 33 yo and breastfed exclusively with no formula supplement during his first year of life and continued nursing until recently.  Mom states she knows how to hand express colostrum and her new baby has been latching well with strong sucking bursts.  LATCH scores of 8 at 2 feedings, per RN assessment and baby has nursed multiple times with output already above minimum at this hour of life.  LC encouraged STS and cue feedings and briefly discussed how some preterm babies may tire out after initially nursing well so encouraged mom to ensure at least q3h feedings.  [facility] need for mom to pump but a this time, baby nursing well. LC encouraged review of Baby and Me pp 9, 14 and 20-25 for STS and BF information. LC provided [facility] Resource brochure and reviewed [facility] services and list of community and web site resources.      Maternal Data Formula Feeding for Exclusion: Yes Reason for exclusion: Admission to Intensive Care Unit (ICU) post-partum Infant to breast within first hour of birth: Yes Has patient been taught Hand Expression?: Yes (mom states she knows how to hand express) Does the patient have breastfeeding experience prior to this delivery?: Yes  Feeding Feeding Type: Breast Fed Length of feed: 10 min  LATCH Score/Interventions            LATCH score=8 x2 since birth          Lactation Tools Discussed/Used   STS, cue feeding, hand expression  Consult Status Consult Status: Follow-up Date: 10/05/13 Follow-up type: In-patient    Warrick ParisianBryant, Paulette Lynch [facility] 10/04/2013, 6:08 PM

## 2013-10-04 NOTE — Anesthesia Procedure Notes (Signed)
Spinal  Patient location during procedure: OR Preanesthetic Checklist Completed: patient identified, site marked, surgical consent, pre-op evaluation, timeout performed, IV checked, risks and benefits discussed and monitors and equipment checked Spinal Block Patient position: sitting Prep: DuraPrep Patient monitoring: heart rate, cardiac monitor, continuous pulse ox and blood pressure Approach: midline Location: L3-4 Injection technique: single-shot Needle Needle type: Sprotte  Needle gauge: 24 G Needle length: 9 cm Assessment Sensory level: T4 Additional Notes Spinal Dosage in OR  Bupivicaine ml       1.1 PFMS04   mcg        150 Fentanyl mcg            25    

## 2013-10-05 ENCOUNTER — Encounter (HOSPITAL_COMMUNITY): Payer: Self-pay | Admitting: Obstetrics and Gynecology

## 2013-10-05 DIAGNOSIS — Z98891 History of uterine scar from previous surgery: Secondary | ICD-10-CM

## 2013-10-05 LAB — CBC
HEMATOCRIT: 25.5 % — AB (ref 36.0–46.0)
HEMOGLOBIN: 8.6 g/dL — AB (ref 12.0–15.0)
MCH: 28.7 pg (ref 26.0–34.0)
MCHC: 33.7 g/dL (ref 30.0–36.0)
MCV: 85 fL (ref 78.0–100.0)
Platelets: 199 10*3/uL (ref 150–400)
RBC: 3 MIL/uL — ABNORMAL LOW (ref 3.87–5.11)
RDW: 14.5 % (ref 11.5–15.5)
WBC: 11.2 10*3/uL — ABNORMAL HIGH (ref 4.0–10.5)

## 2013-10-05 LAB — RPR: RPR Ser Ql: NONREACTIVE

## 2013-10-05 MED ORDER — LABETALOL HCL 300 MG PO TABS
600.0000 mg | ORAL_TABLET | Freq: Three times a day (TID) | ORAL | Status: DC
  Administered 2013-10-05 – 2013-10-06 (×3): 600 mg via ORAL
  Filled 2013-10-05 (×5): qty 2

## 2013-10-05 MED ORDER — SODIUM CHLORIDE 0.9 % IJ SOLN: 3.0000 mL | INTRAMUSCULAR | Status: DC | PRN

## 2013-10-05 MED ORDER — NIFEDIPINE ER 60 MG PO TB24
60.0000 mg | ORAL_TABLET | Freq: Once | ORAL | Status: DC
  Filled 2013-10-05: qty 1

## 2013-10-05 MED ORDER — SODIUM CHLORIDE 0.9 % IJ SOLN
3.0000 mL | Freq: Two times a day (BID) | INTRAMUSCULAR | Status: DC
  Administered 2013-10-05 – 2013-10-06 (×2): 3 mL via INTRAVENOUS

## 2013-10-05 NOTE — Progress Notes (Signed)
Subjective: Postpartum Day 1: Cesarean Delivery Patient reports that pain is well-managed.Lochia normal.  Ambulating, voiding, tolerating diet as ordered without difficulty. Normal flatus. Absent bowel movement. Denies symptoms of pre-eclampsia  Objective: Vital signs in last 24 hours: Temp:  [97.7 F (36.5 C)-99.1 F (37.3 C)] 98.6 F (37 C) (01/19 0815) Pulse Rate:  [65-95] 92 (01/19 0900) Resp:  [15-18] 16 (01/19 0815) BP: (114-169)/(56-100) 133/73 mmHg (01/19 0900) SpO2:  [95 %-100 %] 97 % (01/19 0900) Weight:  [149 lb 11.2 oz (67.903 kg)-152 lb 11.2 oz (69.264 kg)] 152 lb 11.2 oz (69.264 kg) (01/19 0600)  Physical Exam:  General: alert Lochia: appropriate Uterine Fundus: firm and appropriately tender Incision: dressing dry and clean DVT Evaluation: No evidence of DVT seen on physical exam. Edema minimal. DTR 2/4. No clonus   Recent Labs  10/04/13 0539 10/05/13 0530  HGB 9.3* 8.6*  HCT 28.1* 25.5*    Assessment/Plan: Status post Cesarean section. Doing well postoperatively.  D/C Magnesium. Continue Labetalol 600 mg TID Anticipate discharge 10/07/13  Alicia Bryant A 10/05/2013, 9:52 AM

## 2013-10-05 NOTE — Lactation Note (Addendum)
This note was copied from the chart of Alicia Berline Choughomika Lachapelle. Lactation Consultation Note      Follow up consult with this mom and baby, now 26 hours post partum. The baby is now 37 1/[redacted] weeks gestation. Mom is in AICU, and just had her magnesium drip discontinued. I assisted mom with latching in both football and cross cradle hold, but he would latch and not suckle. I hand expressed mom's breast and spoon fed 5 mls of colostrum, which he lapped up eagerly. He then cued and latched, but again slept. Mom reports he has breast fed for al long as 45 minutes, since birth. I reviewed cluster feedign with mom, and the breast feeding pages in the babyand Me book. Mom know tocall for questions/concerns/help with  handf expression and spoon feeding, as needed  Patient Name: Alicia Bryant [id number]'YToday's Date: 10/05/2013 Reason for consult: Follow-up assessment   Maternal Data    Feeding Feeding Type: Breast Milk  LATCH Score/Interventions                      Lactation Tools Discussed/Used     Consult Status Consult Status: Follow-up Date: 10/06/13 Follow-up type: In-patient    Alfred LevinsLee, Tajanae Guilbault Anne 10/05/2013, 10:37 AM

## 2013-10-05 NOTE — Progress Notes (Signed)
[id number] report given to Vanessa DurhamJoanne Dyson, 3p-11p RN

## 2013-10-05 NOTE — Progress Notes (Signed)
UR chart review completed.  

## 2013-10-06 MED ORDER — LABETALOL HCL 300 MG PO TABS
600.0000 mg | ORAL_TABLET | Freq: Four times a day (QID) | ORAL | Status: DC
  Administered 2013-10-07 (×3): 600 mg via ORAL
  Filled 2013-10-06 (×7): qty 2

## 2013-10-06 NOTE — Progress Notes (Signed)
Subjective: Postpartum Day 2: Cesarean Delivery Patient reports tolerating PO, + flatus and no problems voiding.   No headache or blurred vision Objective: Vital signs in last 24 hours: Temp:  [97.5 F (36.4 C)-98.7 F (37.1 C)] 97.8 F (36.6 C) (01/20 1850) Pulse Rate:  [77-92] 78 (01/20 1850) Resp:  [18] 18 (01/20 1850) BP: (130-165)/(72-79) 165/79 mmHg (01/20 1850) SpO2:  [97 %] 97 % (01/20 0100)  Physical Exam:  General: alert and cooperative Lochia: appropriate Uterine Fundus: firm Incision: healing well, no significant drainage DVT Evaluation: No evidence of DVT seen on physical exam. cv RRR Lungs CTA B   Recent Labs  10/04/13 0539 10/05/13 0530  HGB 9.3* 8.6*  HCT 28.1* 25.5*    Assessment/Plan: Status post Cesarean section. Doing well postoperatively.  Continue current care. Increase labetalol to 600mg  QID  Liston Thum A 10/06/2013, 6:56 PM

## 2013-10-06 NOTE — Progress Notes (Signed)
Patient ID: Alicia Bryant, female   DOB: 07/20/1981, 33 y.o.   MRN: [id number] Pt in the shower.

## 2013-10-07 LAB — TYPE AND SCREEN
ABO/RH(D): A POS
Antibody Screen: NEGATIVE
UNIT DIVISION: 0
UNIT DIVISION: 0
Unit division: 0

## 2013-10-07 MED ORDER — SENNOSIDES-DOCUSATE SODIUM 8.6-50 MG PO TABS: 2.0000 | ORAL_TABLET | ORAL | Status: DC

## 2013-10-07 MED ORDER — NIFEDIPINE ER 30 MG PO TB24
30.0000 mg | ORAL_TABLET | Freq: Once | ORAL | Status: AC
  Administered 2013-10-07: 30 mg via ORAL
  Filled 2013-10-07: qty 1

## 2013-10-07 MED ORDER — FERROUS FUMARATE 325 (106 FE) MG PO TABS
1.0000 | ORAL_TABLET | Freq: Two times a day (BID) | ORAL | Status: DC
  Administered 2013-10-07: 106 mg via ORAL
  Filled 2013-10-07 (×3): qty 1

## 2013-10-07 MED ORDER — LABETALOL HCL 300 MG PO TABS: 600.0000 mg | ORAL_TABLET | Freq: Four times a day (QID) | ORAL | Status: DC

## 2013-10-07 MED ORDER — IBUPROFEN 600 MG PO TABS: 600.0000 mg | ORAL_TABLET | Freq: Four times a day (QID) | ORAL | Status: DC

## 2013-10-07 MED ORDER — OXYCODONE-ACETAMINOPHEN 5-325 MG PO TABS: 1.0000 | ORAL_TABLET | ORAL | Status: DC | PRN

## 2013-10-07 NOTE — Discharge Instructions (Signed)
Hypertension During Pregnancy Hypertension is also called high blood pressure. It can occur at any time in life and during pregnancy. When you have hypertension, there is extra pressure inside your blood vessels that carry blood from the heart to the rest of your body (arteries). Hypertension during pregnancy can cause problems for you and your baby. Your baby might not weigh as much as it should at birth or might be born early (premature). Very bad cases of hypertension during pregnancy can be life threatening.  Different types of hypertension can occur during pregnancy.   Chronic hypertension. This happens when a woman has hypertension before pregnancy and it continues during pregnancy.  Gestational hypertension. This is when hypertension develops during pregnancy.  Preeclampsia or toxemia of pregnancy. This is a very serious type of hypertension that develops only during pregnancy. It is a disease that affects the whole body (systemic) and can be very dangerous for both mother and baby.  Gestational hypertension and preeclampsia usually go away after your baby is born. Blood pressure generally stabilizes within 6 weeks. Women who have hypertension during pregnancy have a greater chance of developing hypertension later in life or with future pregnancies. RISK FACTORS Some factors make you more likely to develop hypertension during pregnancy. Risk factors include:  Having hypertension before pregnancy.  Having hypertension during a previous pregnancy.  Being overweight.  Being older than 57.  Being pregnant with more than one baby (multiples).  Having diabetes or kidney problems. SIGNS AND SYMPTOMS Chronic and gestational hypertension rarely cause symptoms. Preeclampsia has symptoms, which may include:  Increased protein in your urine. Your health care provider will check for this at every prenatal visit.  Swelling of your hands and face.  Rapid weight gain.  Headaches.  Visual  changes.  Being bothered by light.  Abdominal pain, especially in the right upper area.  Chest pain.  Shortness of breath.  Increased reflexes.  Seizures. Seizures occur with a more severe form of preeclampsia, called eclampsia. DIAGNOSIS   You may be diagnosed with hypertension during a regular prenatal exam. At each visit, tests may include:  Blood pressure checks.  A urine test to check for protein in your urine.  The type of hypertension you are diagnosed with depends on when you developed it. It also depends on your specific blood pressure reading.  Developing hypertension before 20 weeks of pregnancy is consistent with chronic hypertension.  Developing hypertension after 20 weeks of pregnancy is consistent with gestational hypertension.  Hypertension with increased urinary protein is diagnosed as preeclampsia.  Blood pressure measurements that stay above 696 systolic or 789 diastolic are a sign of severe preeclampsia. TREATMENT Treatment for hypertension during pregnancy varies. Treatment depends on the type of hypertension and how serious it is.  If you take medicine for chronic hypertension, you may need to switch medicines.  Drugs called ACE inhibitors should not be taken during pregnancy.  Low-dose aspirin may be suggested for women who have risk factors for preeclampsia.  If you have gestational hypertension, you may need to take a blood pressure medicine that is safe during pregnancy. Your health care provider will recommend the appropriate medicine.  If you have severe preeclampsia, you may need to be in the hospital. Health care providers will watch you and the baby very closely. You also may need to take medicine (magnesium sulfate) to prevent seizures and lower blood pressure.  Sometimes an early delivery is needed. This may be the case if the condition worsens. It would  be done to protect you and the baby. The only cure for preeclampsia is delivery. HOME  CARE INSTRUCTIONS  Schedule and keep all of your regular appointments for prenatal care.  Only take over-the-counter or prescription medicines as directed by your health care provider. Tell your health care provider about all medicines you take.  Eat as little salt as possible.  Get regular exercise.  Do not drink alcohol.  Do not use tobacco products.  Do not drink products with caffeine.  Lie on your left side when resting. SEEK IMMEDIATE MEDICAL CARE IF:  You have severe abdominal pain.  You have sudden swelling in the hands, ankles, or face.  You gain 4 pounds (1.8 kg) or more in 1 week.  You vomit repeatedly.  You have vaginal bleeding.  You do not feel the baby moving as much.  You have a headache.  You have blurred or double vision.  You have muscle twitching or spasms.  You have shortness of breath.  You have blue fingernails and lips.  You have blood in your urine. MAKE SURE YOU:  Understand these instructions.  Will watch your condition.  Will get help right away if you are not doing well or get worse. Document Released: 05/22/2011 Document Revised: 06/24/2013 Document Reviewed: 04/02/2013 [facility] Patient Information 2014 [place]. Cesarean Delivery Care After Refer to this sheet in the next few weeks. These instructions provide you with information on caring for yourself after your procedure. Your health care provider may also give you specific instructions. Your treatment has been planned according to current medical practices, but problems sometimes occur. Call your health care provider if you have any problems or questions after you go home. HOME CARE INSTRUCTIONS  Only take over-the-counter or prescription medications as directed by your health care provider.  Do not drink alcohol, especially if you are breastfeeding or taking medication to relieve pain.  Do not chew or smoke tobacco.  Continue to use good perineal care. Good  perineal care includes:  Wiping your perineum from front to back.  Keeping your perineum clean.  Check your surgical cut (incision) daily for increased redness, drainage, swelling, or separation of skin.  Clean your incision gently with soap and water every day, and then pat it dry. If your health care provider says it is OK, leave the incision uncovered. Use a bandage (dressing) if the incision is draining fluid or appears irritated. If the adhesive strips across the incision do not fall off within 7 days, carefully peel them off.  Hug a pillow when coughing or sneezing until your incision is healed. This helps to relieve pain.  Do not use tampons or douche until your health care provider says it is okay.  Shower, wash your hair, and take tub baths as directed by your health care provider.  Wear a well-fitting bra that provides breast support.  Limit wearing support panties or control-top hose.  Drink enough fluids to keep your urine clear or pale yellow.  Eat high-fiber foods such as whole grain cereals and breads, brown rice, beans, and fresh fruits and vegetables every day. These foods may help prevent or relieve constipation.  Resume activities such as climbing stairs, driving, lifting, exercising, or traveling as directed by your health care provider.  Talk to your health care provider about resuming sexual activities. This is dependent upon your risk of infection, your rate of healing, and your comfort and desire to resume sexual activity.  Try to have someone help you with  your household activities and your newborn for at least a few days after you leave the hospital.  Rest as much as possible. Try to rest or take a nap when your newborn is sleeping.  Increase your activities gradually.  Keep all of your scheduled postpartum appointments. It is very important to keep your scheduled follow-up appointments. At these appointments, your health care provider will be checking to  make sure that you are healing physically and emotionally. SEEK MEDICAL CARE IF:   You are passing large clots from your vagina. Save any clots to show your health care provider.  You have a foul smelling discharge from your vagina.  You have trouble urinating.  You are urinating frequently.  You have pain when you urinate.  You have a change in your bowel movements.  You have increasing redness, pain, or swelling near your incision.  You have pus draining from your incision.  Your incision is separating.  You have painful, hard, or reddened breasts.  You have a severe headache.  You have blurred vision or see spots.  You feel sad or depressed.  You have thoughts of hurting yourself or your newborn.  You have questions about your care, the care of your newborn, or medications.  You are dizzy or lightheaded.  You have a rash.  You have pain, redness, or swelling at the site of the removed intravenous access (IV) tube.  You have nausea or vomiting.  You stopped breastfeeding and have not had a menstrual period within 12 weeks of stopping.  You are not breastfeeding and have not had a menstrual period within 12 weeks of delivery.  You have a fever. SEEK IMMEDIATE MEDICAL CARE IF:  You have persistent pain.  You have chest pain.  You have shortness of breath.  You faint.  You have leg pain.  You have stomach pain.  Your vaginal bleeding saturates 2 or more sanitary pads in 1 hour. MAKE SURE YOU:   Understand these instructions.  Will watch your condition.  Will get help right away if you are not doing well or get worse. Document Released: 05/26/2002 Document Revised: 05/06/2013 Document Reviewed: 04/30/2012 [facility] Patient Information 2014 [place], [place]. Cesarean Delivery Care After Refer to this sheet in the next few weeks. These instructions provide you with information on caring for yourself after your procedure. Your health care provider may  also give you specific instructions. Your treatment has been planned according to current medical practices, but problems sometimes occur. Call your health care provider if you have any problems or questions after you go home. HOME CARE INSTRUCTIONS  Only take over-the-counter or prescription medications as directed by your health care provider.  Do not drink alcohol, especially if you are breastfeeding or taking medication to relieve pain.  Do not chew or smoke tobacco.  Continue to use good perineal care. Good perineal care includes:  Wiping your perineum from front to back.  Keeping your perineum clean.  Check your surgical cut (incision) daily for increased redness, drainage, swelling, or separation of skin.  Clean your incision gently with soap and water every day, and then pat it dry. If your health care provider says it is OK, leave the incision uncovered. Use a bandage (dressing) if the incision is draining fluid or appears irritated. If the adhesive strips across the incision do not fall off within 7 days, carefully peel them off.  Hug a pillow when coughing or sneezing until your incision is healed. This helps to relieve  pain.  Do not use tampons or douche until your health care provider says it is okay.  Shower, wash your hair, and take tub baths as directed by your health care provider.  Wear a well-fitting bra that provides breast support.  Limit wearing support panties or control-top hose.  Drink enough fluids to keep your urine clear or pale yellow.  Eat high-fiber foods such as whole grain cereals and breads, brown rice, beans, and fresh fruits and vegetables every day. These foods may help prevent or relieve constipation.  Resume activities such as climbing stairs, driving, lifting, exercising, or traveling as directed by your health care provider.  Talk to your health care provider about resuming sexual activities. This is dependent upon your risk of infection,  your rate of healing, and your comfort and desire to resume sexual activity.  Try to have someone help you with your household activities and your newborn for at least a few days after you leave the hospital.  Rest as much as possible. Try to rest or take a nap when your newborn is sleeping.  Increase your activities gradually.  Keep all of your scheduled postpartum appointments. It is very important to keep your scheduled follow-up appointments. At these appointments, your health care provider will be checking to make sure that you are healing physically and emotionally. SEEK MEDICAL CARE IF:   You are passing large clots from your vagina. Save any clots to show your health care provider.  You have a foul smelling discharge from your vagina.  You have trouble urinating.  You are urinating frequently.  You have pain when you urinate.  You have a change in your bowel movements.  You have increasing redness, pain, or swelling near your incision.  You have pus draining from your incision.  Your incision is separating.  You have painful, hard, or reddened breasts.  You have a severe headache.  You have blurred vision or see spots.  You feel sad or depressed.  You have thoughts of hurting yourself or your newborn.  You have questions about your care, the care of your newborn, or medications.  You are dizzy or lightheaded.  You have a rash.  You have pain, redness, or swelling at the site of the removed intravenous access (IV) tube.  You have nausea or vomiting.  You stopped breastfeeding and have not had a menstrual period within 12 weeks of stopping.  You are not breastfeeding and have not had a menstrual period within 12 weeks of delivery.  You have a fever. SEEK IMMEDIATE MEDICAL CARE IF:  You have persistent pain.  You have chest pain.  You have shortness of breath.  You faint.  You have leg pain.  You have stomach pain.  Your vaginal bleeding  saturates 2 or more sanitary pads in 1 hour. MAKE SURE YOU:   Understand these instructions.  Will watch your condition.  Will get help right away if you are not doing well or get worse. Document Released: 05/26/2002 Document Revised: 05/06/2013 Document Reviewed: 04/30/2012 [facility] Patient Information 2014 [place], [place].

## 2013-10-07 NOTE — Discharge Summary (Signed)
Obstetric Discharge Summary Reason for Admission: cesarean section and uncontrolled PIH Prenatal Procedures: NST and ultrasound Intrapartum Procedures: breech extraction and cesarean: low cervical, transverse Postpartum Procedures: antihypertensives Complications-Operative and Postpartum: none Hemoglobin  Date Value Range Status  10/05/2013 8.6* 12.0 - 15.0 g/dL Final     HCT  Date Value Range Status  10/05/2013 25.5* 36.0 - 46.0 % Final    Physical Exam:  General: alert and cooperative Lochia: appropriate Uterine Fundus: firm Incision: healing well DVT Evaluation: No evidence of DVT seen on physical exam. Physical Examination: Chest - clear to auscultation, no wheezes, rales or rhonchi, symmetric air entry Heart - normal rate and regular rhythm   [facility].  She had a repeat CS and received magnesium sulfate for seizure prophylaxis.  She will be sent home on labetalol and procardia and follow up on one week Discharge Diagnoses: Term Pregnancy-delivered and PIH  Discharge Information: Date: 10/07/2013 Activity: pelvic rest Diet: routine Medications: PNV, Ibuprofen and Percocet Condition: stable Instructions: refer to practice specific booklet Discharge to: home Follow-up Information   Follow up In 1 day. (keep scheduled appt in office tomorrow)       Follow up with Alicia FritzARTER, [name], FNP On 10/14/2013. (@1 :30)    Specialty:  Family Medicine   Contact information:   [address] [place] [address] [phone]       Newborn Data: Live born female  Birth Weight: 6 lb 4.5 oz (2849 g) APGAR: 8, 9  Home with mother. Pts BP imporved with procardia Follow up in one week for BP check  Alicia Bryant A 10/07/2013, 12:40 PM

## 2013-10-07 NOTE — Progress Notes (Signed)
Subjective: Postpartum Day 3: Cesarean Delivery Patient reports tolerating PO, + flatus and no problems voiding.    Objective: Vital signs in last 24 hours: Temp:  [97.7 F (36.5 C)-98.4 F (36.9 C)] 98.4 F (36.9 C) (01/21 0630) Pulse Rate:  [73-93] 93 (01/21 1023) Resp:  [18] 18 (01/21 0630) BP: (151-186)/(78-99) 166/99 mmHg (01/21 1023) SpO2:  [97 %] 97 % (01/21 0009)  Physical Exam:  General: alert and cooperative Lochia: appropriate Uterine Fundus: firm Incision: healing well, no significant drainage DVT Evaluation: No evidence of DVT seen on physical exam.   Recent Labs  10/05/13 0530  HGB 8.6*  HCT 25.5*    Assessment/Plan: Status post Cesarean section. Doing well postoperatively. Pt with PIH and blood pressures are still elevated.  she desires to go home.  will give a dose of procardia.  recheck BP at noon.  if lowered will dc home  Continue current care.  Alicia Bryant A 10/07/2013, 11:16 AM

## 2013-10-07 NOTE — Lactation Note (Signed)
This note was copied from the chart of Alicia Alicia Bryant. Lactation Consultation Note    Follow up consult with this mom and baby, now 73 hours post partum. Baby is now 37 3/7 weeks corrected gestation. He is breast feeding well, very vigorous, but mom was having trouble latching him to her left breast. We tried cross cradle, then football. He would latch and then pull away. I then swaddled him, and eventually , after a minute or so, he latched deeply with strong, rhythmic sucks and swallows. Mom pleased. She reports tender breasts - they are full and knotty - but not engorged. I  gave her ice packs and instructed her in their use.  She also reports sore nipples, so I gave her comfort gels and instructed her in their use. Her nipples appear intact, normal - I think they are sore from the baby pulling on and off the breast. Mom is aware she can call for questions/concerns after discharge  Patient Name: Alicia Bryant Reason for consult: Follow-up assessment   Maternal Data    Feeding Feeding Type: Breast Fed  LATCH Score/Interventions Latch: Repeated attempts needed to sustain latch, nipple held in mouth throughout feeding, stimulation needed to elicit sucking reflex. (baby swaddled to calm to prevent his arms from moving) Intervention(s): Adjust position;Assist with latch;Breast compression  Audible Swallowing: Spontaneous and intermittent Intervention(s): Skin to skin;Hand expression  Type of Nipple: Everted at rest and after stimulation  Comfort (Breast/Nipple): Filling, red/small blisters or bruises, mild/mod discomfort  Problem noted: Filling;Mild/Moderate discomfort Interventions  (Cracked/bleeding/bruising/blister): Expressed breast milk to nipple (ice to prevent enogrgement) Interventions (Mild/moderate discomfort): Comfort gels  Hold (Positioning): Assistance needed to correctly position infant at breast and maintain latch. Intervention(s):  Breastfeeding basics reviewed;Support Pillows;Position options;Skin to skin  LATCH Score: 7  Lactation Tools Discussed/Used     Consult Status Consult Status: Complete Follow-up type: Call as needed    Alfred LevinsLee, Saquan Furtick [name], 9:36 AM

## 2014-07-19 ENCOUNTER — Encounter (HOSPITAL_COMMUNITY): Payer: Self-pay | Admitting: Obstetrics and Gynecology

## 2015-04-03 ENCOUNTER — Encounter (HOSPITAL_COMMUNITY): Payer: Self-pay | Admitting: Emergency Medicine

## 2015-04-03 ENCOUNTER — Telehealth: Payer: Self-pay | Admitting: Family

## 2015-04-03 ENCOUNTER — Emergency Department (HOSPITAL_COMMUNITY)
Admission: EM | Admit: 2015-04-03 | Discharge: 2015-04-03 | Disposition: A | Discharge status: Home / Self Care | Payer: Self-pay | Attending: Emergency Medicine | Admitting: Emergency Medicine

## 2015-04-03 DIAGNOSIS — R590 Localized enlarged lymph nodes: Secondary | ICD-10-CM | POA: Insufficient documentation

## 2015-04-03 DIAGNOSIS — J029 Acute pharyngitis, unspecified: Secondary | ICD-10-CM | POA: Insufficient documentation

## 2015-04-03 DIAGNOSIS — Z8679 Personal history of other diseases of the circulatory system: Secondary | ICD-10-CM | POA: Insufficient documentation

## 2015-04-03 DIAGNOSIS — R6883 Chills (without fever): Secondary | ICD-10-CM | POA: Insufficient documentation

## 2015-04-03 DIAGNOSIS — N3000 Acute cystitis without hematuria: Secondary | ICD-10-CM

## 2015-04-03 DIAGNOSIS — Z79899 Other long term (current) drug therapy: Secondary | ICD-10-CM | POA: Insufficient documentation

## 2015-04-03 DIAGNOSIS — N3090 Cystitis, unspecified without hematuria: Secondary | ICD-10-CM | POA: Insufficient documentation

## 2015-04-03 DIAGNOSIS — R011 Cardiac murmur, unspecified: Secondary | ICD-10-CM | POA: Insufficient documentation

## 2015-04-03 LAB — URINALYSIS, ROUTINE W REFLEX MICROSCOPIC
BILIRUBIN URINE: NEGATIVE
Glucose, UA: NEGATIVE mg/dL
Leukocytes, UA: NEGATIVE
NITRITE: NEGATIVE
Protein, ur: NEGATIVE mg/dL
SPECIFIC GRAVITY, URINE: 1.025 (ref 1.005–1.030)
UROBILINOGEN UA: 0.2 mg/dL (ref 0.0–1.0)
pH: 6 (ref 5.0–8.0)

## 2015-04-03 LAB — URINE MICROSCOPIC-ADD ON

## 2015-04-03 MED ORDER — PHENAZOPYRIDINE HCL 100 MG PO TABS
200.0000 mg | ORAL_TABLET | Freq: Once | ORAL | Status: AC
  Administered 2015-04-03: 200 mg via ORAL
  Filled 2015-04-03: qty 2

## 2015-04-03 MED ORDER — SULFAMETHOXAZOLE-TRIMETHOPRIM 800-160 MG PO TABS
1.0000 | ORAL_TABLET | Freq: Once | ORAL | Status: AC
  Administered 2015-04-03: 1 via ORAL
  Filled 2015-04-03: qty 1

## 2015-04-03 MED ORDER — PHENAZOPYRIDINE HCL 200 MG PO TABS: 200.0000 mg | ORAL_TABLET | Freq: Three times a day (TID) | ORAL | Status: DC

## 2015-04-03 MED ORDER — SULFAMETHOXAZOLE-TRIMETHOPRIM 800-160 MG PO TABS: 1.0000 | ORAL_TABLET | Freq: Two times a day (BID) | ORAL | Status: DC

## 2015-04-03 NOTE — Progress Notes (Signed)

## 2015-04-03 NOTE — ED Provider Notes (Signed)
CSN: [id number]     Arrival date & time 04/03/15  1824 History   This chart was scribed for Alicia BuffaloHope Neese, NP working with Donnetta HutchingBrian Cook, MD by Elveria Risingimelie Horne, ED Scribe. This patient was seen in room APFT24/APFT24 and the patient's care was started at 6:54 PM.   Chief Complaint  Patient presents with  . Chills   HPI HPI Comments: Alicia Bryant is a 34 y.o. female who presents to the Emergency Department complaining of an array of symptoms inlcuding subjective fever, chills, diaphoresis at night, myalgias, pressure in lower abdomen and lower back pain for two days now. Patient diagnosed with UTI via e-visit this evening and prescribed Bactrim. Patient states that she is still breastfeeding her 63month old child and is concerned about contraindications. Patient denies urinary urgency, frequency, or sensation of not fully emptying her bladder. Patient reports UTI in 10/2014, during which she experienced urinary symptoms of hematuria, urgency and frequency; her current symptoms do not include these. Patient reports scratchy throat this morning, but denies additional cold symptoms of congestion or cough.    Past Medical History  Diagnosis Date  . Migraine   . Heart murmur   . Pregnancy induced hypertension   . Blood transfusion without reported diagnosis 2010    after c/s delivery   Past Surgical History  Procedure Laterality Date  . Cesarean section  2010  . Tonsillectomy  2004  . Cesarean section N/A 10/04/2013    Procedure: CESAREAN SECTION;  Surgeon: Dorien Chihuahuaara J. Richardson Doppole, MD;  Location: [facility];  Service: Obstetrics;  Laterality: N/A;   Family History  Problem Relation Age of Onset  . Hypertension Father   . Diabetes Father    History  Substance Use Topics  . Smoking status: Never Smoker   . Smokeless tobacco: Never Used  . Alcohol Use: No   OB History    Gravida Para Term Preterm AB TAB SAB Ectopic Multiple Living   3 2 2  1     2      Review of Systems  Constitutional: Positive for  chills and diaphoresis.  HENT: Positive for sore throat. Negative for congestion.   Respiratory: Negative for cough.   Gastrointestinal: Positive for nausea and abdominal pain. Negative for vomiting and diarrhea.  Genitourinary: Negative for dysuria, urgency, frequency and hematuria.  Musculoskeletal: Positive for back pain.  All other systems reviewed and are negative.     Allergies  Banana and Shellfish allergy  Home Medications   Prior to Admission medications   Medication Sig Start Date End Date Taking? Authorizing Provider  ibuprofen (ADVIL,MOTRIN) 600 MG tablet Take 1 tablet (600 mg total) by mouth every 6 (six) hours. 10/07/13  Yes Jaymes GraffNaima Dillard, MD  Prenatal MV-Min-Fe Fum-FA-DHA (PRENATAL+DHA PO) Take 1 tablet by mouth daily.   Yes Historical Provider, MD  sulfamethoxazole-trimethoprim (BACTRIM DS,SEPTRA DS) 800-160 MG per tablet Take 1 tablet by mouth 2 (two) times daily. 04/03/15  Yes Junie Spencerhristy A Hawks, FNP  acetaminophen (TYLENOL) 500 MG tablet Take 1,000 mg by mouth every 6 (six) hours as needed for headache.     Historical Provider, MD  diphenhydramine-acetaminophen (TYLENOL PM) 25-500 MG TABS Take 2 tablets by mouth at bedtime as needed (sleep).    Historical Provider, MD  labetalol (NORMODYNE) 300 MG tablet Take 2 tablets (600 mg total) by mouth every 6 (six) hours. 10/07/13   Jaymes GraffNaima Dillard, MD  NIFEdipine (PROCARDIA-XL/ADALAT CC) 30 MG 24 hr tablet Take 1 tablet (30 mg total) by mouth daily. 10/01/13  Osborn Coho, MD  oxyCODONE-acetaminophen (ROXICET) 5-325 MG per tablet Take 1-2 tablets by mouth every 4 (four) hours as needed for severe pain. 10/07/13   Jaymes Graff, MD  pantoprazole (PROTONIX) 40 MG tablet Take 40 mg by mouth daily.    Historical Provider, MD  phenazopyridine (PYRIDIUM) 200 MG tablet Take 1 tablet (200 mg total) by mouth 3 (three) times daily. 04/03/15   [name], NP  senna-docusate (SENOKOT-S) 8.6-50 MG per tablet Take 2 tablets by mouth daily. 10/07/13    Jaymes Graff, MD   Triage Vitals: BP 122/70 mmHg  Pulse 98  Temp(Src) 98.7 F (37.1 C) (Oral)  Resp 20  Ht 4\' 11"  (1.499 m)  Wt 145 lb (65.772 kg)  BMI 29.27 kg/m2  SpO2 100%  LMP 03/21/2015  Breastfeeding? Yes Physical Exam  Constitutional: She is oriented to person, place, and time. She appears well-developed and well-nourished. No distress.  HENT:  Head: Normocephalic and atraumatic.  Right Ear: Tympanic membrane normal.  Left Ear: Tympanic membrane normal.  Mouth/Throat: Uvula is midline, oropharynx is clear and moist and mucous membranes are normal.     Eyes: EOM are normal. Pupils are equal, round, and reactive to light.  Neck: Neck supple. No tracheal deviation present.  Cardiovascular: Normal rate and regular rhythm.   Murmur heard. Pulmonary/Chest: Effort normal and breath sounds normal. No respiratory distress. She has no wheezes. She has no rales.  Abdominal: Soft. Bowel sounds are normal. There is tenderness in the suprapubic area. There is no rebound, no guarding and no CVA tenderness.  Musculoskeletal: Normal range of motion.  Lymphadenopathy:    She has cervical adenopathy.  Neurological: She is alert and oriented to person, place, and time.  Skin: Skin is warm and dry.  Psychiatric: She has a normal mood and affect. Her behavior is normal.  Nursing note and vitals reviewed.   ED Course  Procedures (including critical care time)  COORDINATION OF CARE: 7:01 PM- Discussed treatment plan with patient at bedside and patient agreed to plan.   Labs Review Results for orders placed or performed during the hospital encounter of 04/03/15 (from the past 24 hour(s))  Urinalysis, Routine w reflex microscopic (not at [facility])     Status: Abnormal   Collection Time: 04/03/15  6:45 PM  Result Value Ref Range   Color, Urine YELLOW YELLOW   APPearance CLEAR CLEAR   Specific Gravity, Urine 1.025 1.005 - 1.030   pH 6.0 5.0 - 8.0   Glucose, UA NEGATIVE NEGATIVE mg/dL    Hgb urine dipstick SMALL (A) NEGATIVE   Bilirubin Urine NEGATIVE NEGATIVE   Ketones, ur TRACE (A) NEGATIVE mg/dL   Protein, ur NEGATIVE NEGATIVE mg/dL   Urobilinogen, UA 0.2 0.0 - 1.0 mg/dL   Nitrite NEGATIVE NEGATIVE   Leukocytes, UA NEGATIVE NEGATIVE  Urine microscopic-add on     Status: Abnormal   Collection Time: 04/03/15  6:45 PM  Result Value Ref Range   Squamous Epithelial / LPF FEW (A) RARE   WBC, UA 0-2 <3 WBC/hpf   RBC / HPF 3-6 <3 RBC/hpf   Bacteria, UA FEW (A) RARE   Urine-Other MUCOUS PRESENT       MDM  34 y.o. female with chills and possible fever and low back pain x 2 days. Stable for d/c without signs of pyelonephritis. Patient does not appear toxic. She will fill her Rx that she was e-prescribed earlier today for Bactrim. We have sent her urine for culture. Discussed with the patient  clinical and lab findings and plan of care and all questioned fully answered. She will return if any problems arise.   Final diagnoses:  Cystitis   I personally performed the services described in this documentation, which was scribed in my presence. The recorded information has been reviewed and is accurate.   [address] [place], NP 04/03/15 1935  Donnetta Hutching, MD 04/03/15 2001

## 2015-04-03 NOTE — ED Notes (Signed)
Pt states that she has been having chills and aches since returning from the beach on Friday.  Was dx with UTI via [facility] e-visit.  Started on Bactrim but is breastfeeding and was not sure if that was correct prescription.

## 2015-04-03 NOTE — Discharge Instructions (Signed)
We have sent your urine for culture. Go ahead and start the Bactrim. We will call if we need to change the antibiotic.

## 2015-04-05 LAB — URINE CULTURE: Culture: NO GROWTH

## 2015-05-05 ENCOUNTER — Emergency Department (HOSPITAL_COMMUNITY)
Admission: EM | Admit: 2015-05-05 | Discharge: 2015-05-06 | Disposition: A | Discharge status: Home / Self Care | Payer: 59 | Attending: Emergency Medicine | Admitting: Emergency Medicine

## 2015-05-05 ENCOUNTER — Encounter (HOSPITAL_COMMUNITY): Payer: Self-pay | Admitting: Emergency Medicine

## 2015-05-05 ENCOUNTER — Emergency Department (HOSPITAL_COMMUNITY): Payer: 59

## 2015-05-05 DIAGNOSIS — Z8679 Personal history of other diseases of the circulatory system: Secondary | ICD-10-CM | POA: Insufficient documentation

## 2015-05-05 DIAGNOSIS — R0602 Shortness of breath: Secondary | ICD-10-CM | POA: Insufficient documentation

## 2015-05-05 DIAGNOSIS — Z79899 Other long term (current) drug therapy: Secondary | ICD-10-CM | POA: Diagnosis not present

## 2015-05-05 DIAGNOSIS — R05 Cough: Secondary | ICD-10-CM | POA: Diagnosis not present

## 2015-05-05 DIAGNOSIS — R079 Chest pain, unspecified: Secondary | ICD-10-CM | POA: Insufficient documentation

## 2015-05-05 DIAGNOSIS — R011 Cardiac murmur, unspecified: Secondary | ICD-10-CM | POA: Diagnosis not present

## 2015-05-05 DIAGNOSIS — R0789 Other chest pain: Secondary | ICD-10-CM | POA: Diagnosis present

## 2015-05-05 MED ORDER — IBUPROFEN 400 MG PO TABS
600.0000 mg | ORAL_TABLET | Freq: Once | ORAL | Status: AC
  Administered 2015-05-05: 600 mg via ORAL
  Filled 2015-05-05: qty 2

## 2015-05-05 NOTE — ED Provider Notes (Signed)
CSN: [id number]     Arrival date & time 05/05/15  2205 History  This chart was scribed for Shon Baton, MD by Doreatha Martin, ED Scribe. This patient was seen in room APA04/APA04 and the patient's care was started at 11:15 PM.     Chief Complaint  Patient presents with  . chest discomfort    The history is provided by the patient. No language interpreter was used.    HPI Comments: Alicia Bryant is a 34 y.o. female who presents to the Emergency Department complaining of moderate, non-exertional 8/10 chest discomfort, described as "pressure and heaviness" onset a week ago. She states associated difficulty breathing onset today, dry cough. She states that standing up improves her symptoms. No treatments tried PTA. No Hx of similar symptoms. No Hx of HTN (except during pregnancy), DM, HLD, PE/DVT. No FHx of CAD/CHF<55yo. She is a non-smoker. Pt takes oral contraceptives. Pt denies recent trauma, injury, heavy lifting. She also denies CP, abdominal pain, nausea, vomiting, leg swelling, congestion, abnormal nipple discharge, calor of the breasts.   Past Medical History  Diagnosis Date  . Migraine   . Heart murmur   . Pregnancy induced hypertension   . Blood transfusion without reported diagnosis 2010    after c/s delivery   Past Surgical History  Procedure Laterality Date  . Cesarean section  2010  . Tonsillectomy  2004  . Cesarean section N/A 10/04/2013    Procedure: CESAREAN SECTION;  Surgeon: Dorien Chihuahua. Richardson Dopp, MD;  Location: [facility];  Service: Obstetrics;  Laterality: N/A;   Family History  Problem Relation Age of Onset  . Hypertension Father   . Diabetes Father    Social History  Substance Use Topics  . Smoking status: Never Smoker   . Smokeless tobacco: Never Used  . Alcohol Use: No   OB History    Gravida Para Term Preterm AB TAB SAB Ectopic Multiple Living   Review of Systems  Constitutional: Negative for fever.  HENT: Negative for congestion.    Respiratory: Positive for cough and shortness of breath. Negative for chest tightness.   Cardiovascular: Negative for chest pain and leg swelling.       Chest pressure/heaviness  Gastrointestinal: Negative for nausea, vomiting and abdominal pain.  Genitourinary: Negative for dysuria.  Musculoskeletal: Negative for back pain.  Skin: Negative for wound.  Neurological: Negative for headaches.  Psychiatric/Behavioral: Negative for confusion.  All other systems reviewed and are negative.  Allergies  Banana and Shellfish allergy  Home Medications   Prior to Admission medications   Medication Sig Start Date End Date Taking? Authorizing Provider  acetaminophen (TYLENOL) 500 MG tablet Take 1,000 mg by mouth every 6 (six) hours as needed for headache.     Historical Provider, MD  diphenhydramine-acetaminophen (TYLENOL PM) 25-500 MG TABS Take 2 tablets by mouth at bedtime as needed (sleep).    Historical Provider, MD  ibuprofen (ADVIL,MOTRIN) 600 MG tablet Take 1 tablet (600 mg total) by mouth every 6 (six) hours. 10/07/13   Jaymes Graff, MD  labetalol (NORMODYNE) 300 MG tablet Take 2 tablets (600 mg total) by mouth every 6 (six) hours. 10/07/13   Jaymes Graff, MD  NIFEdipine (PROCARDIA-XL/ADALAT CC) 30 MG 24 hr tablet Take 1 tablet (30 mg total) by mouth daily. 10/01/13   Osborn Coho, MD  oxyCODONE-acetaminophen (ROXICET) 5-325 MG per tablet Take 1-2 tablets by mouth every 4 (four) hours as  needed for severe pain. 10/07/13   Jaymes Graff, MD  pantoprazole (PROTONIX) 40 MG tablet Take 40 mg by mouth daily.    Historical Provider, MD  phenazopyridine (PYRIDIUM) 200 MG tablet Take 1 tablet (200 mg total) by mouth 3 (three) times daily. 04/03/15   [name], NP  Prenatal MV-Min-Fe Fum-FA-DHA (PRENATAL+DHA PO) Take 1 tablet by mouth daily.    Historical Provider, MD  senna-docusate (SENOKOT-S) 8.6-50 MG per tablet Take 2 tablets by mouth daily. 10/07/13   Jaymes Graff, MD   sulfamethoxazole-trimethoprim (BACTRIM DS,SEPTRA DS) 800-160 MG per tablet Take 1 tablet by mouth 2 (two) times daily. 04/03/15   Christy A Hawks, FNP   BP 118/69 mmHg  Pulse 76  Temp(Src) 98.2 F (36.8 C) (Oral)  Resp 15  Ht 4\' 11"  (1.499 m)  Wt 145 lb (65.772 kg)  BMI 29.27 kg/m2  SpO2 100%  LMP 04/25/2015 Physical Exam  Constitutional: She is oriented to person, place, and time. She appears well-developed and well-nourished.  HENT:  Head: Normocephalic and atraumatic.  Eyes: Pupils are equal, round, and reactive to light.  Cardiovascular: Normal rate, regular rhythm and normal heart sounds.   Pulmonary/Chest: Effort normal. No respiratory distress. She has no wheezes. She exhibits no tenderness.  Abdominal: Soft. Bowel sounds are normal. There is no tenderness. There is no rebound.  Musculoskeletal: She exhibits no edema.  Neurological: She is alert and oriented to person, place, and time.  Skin: Skin is warm and dry.  Psychiatric: She has a normal mood and affect.  Nursing note and vitals reviewed.   ED Course  Procedures (including critical care time) DIAGNOSTIC STUDIES: Oxygen Saturation is 100% on RA, normal by my interpretation.    COORDINATION OF CARE: 11:21 PM Discussed treatment plan with pt at bedside and pt agreed to plan.   Labs Review Labs Reviewed  D-DIMER, QUANTITATIVE (NOT AT [facility])  TROPONIN I    Imaging Review Dg Chest 2 View  05/06/2015   CLINICAL DATA:  Chest pain  EXAM: CHEST  2 VIEW  COMPARISON:  10/25/2005  FINDINGS: Limitation from overlapping EKG wires.  Normal heart size and mediastinal contours. No acute infiltrate or edema. No effusion or pneumothorax. No acute osseous findings.  IMPRESSION: No active cardiopulmonary disease.   Electronically Signed   By: Marnee Spring M.D.   On: 05/06/2015 00:39   I have personally reviewed and evaluated these images and lab results as part of my medical decision-making.   EKG  Interpretation   Date/Time:  Thursday May 05 2015 22:33:11 EDT Ventricular Rate:  72 PR Interval:  166 QRS Duration: 80 QT Interval:  409 QTC Calculation: 448 R Axis:   68 Text Interpretation:  Sinus rhythm Confirmed by Chanti Golubski  MD, Alejos Reinhardt  ([id number]) on 05/05/2015 11:07:43 PM      MDM   Final diagnoses:  Chest pain, unspecified chest pain type    Patient presents with chest pain. Discomfort over the last week. Very low risk for ACS. EKG is nonischemic. Troponin and chest x-ray negative for pneumothorax or pneumonia. Patient is on birth control but d-dimer is negative. Chest pain is not reproducible on exam.  Patient was given ibuprofen with relief of symptoms. Patient could have an element of costochondritis. She also endorses recent stressors which could contribute. Given that she is so low risk, feel that she has been adequately screened and can follow-up safely with her primary physician. Patient was given strict return precautions.  After history, exam, and medical  workup I feel the patient has been appropriately medically screened and is safe for discharge home. Pertinent diagnoses were discussed with the patient. Patient was given return precautions.  I personally performed the services described in this documentation, which was scribed in my presence. The recorded information has been reviewed and is accurate.   Shon Baton, MD 05/06/15 [phone]

## 2015-05-05 NOTE — ED Notes (Signed)
Pt reports episodes of chest discomfort this week, worse today. Pt reports being under a lot of stress. No vomiting,nausea, no diaphoresis but pt states she felt clammy. Pt denies chest pain.

## 2015-05-06 LAB — TROPONIN I: Troponin I: <0.03 ng/mL (ref <0.031)

## 2015-05-06 LAB — D-DIMER, QUANTITATIVE: D-Dimer, Quant: 0.31 ug/mL-FEU (ref 0.00–0.48)

## 2015-05-06 NOTE — Discharge Instructions (Signed)
You were seen today for chest pain. Your workup is reassuring. Your EKG, heart tests, and blood clot tests are negative. Your chest pain may be related to inflammation or stress. Your low risk for heart disease. Follow-up with her primary doctor for recheck if pain persists.  Chest Pain (Nonspecific) It is often hard to give a specific diagnosis for the cause of chest pain. There is always a chance that your pain could be related to something serious, such as a heart attack or a blood clot in the lungs. You need to follow up with your health care provider for further evaluation. CAUSES   Heartburn.  Pneumonia or bronchitis.  Anxiety or stress.  Inflammation around your heart (pericarditis) or lung (pleuritis or pleurisy).  A blood clot in the lung.  A collapsed lung (pneumothorax). It can develop suddenly on its own (spontaneous pneumothorax) or from trauma to the chest.  Shingles infection (herpes zoster virus). The chest wall is composed of bones, muscles, and cartilage. Any of these can be the source of the pain.  The bones can be bruised by injury.  The muscles or cartilage can be strained by coughing or overwork.  The cartilage can be affected by inflammation and become sore (costochondritis). DIAGNOSIS  Lab tests or other studies may be needed to find the cause of your pain. Your health care provider may have you take a test called an ambulatory electrocardiogram (ECG). An ECG records your heartbeat patterns over a 24-hour period. You may also have other tests, such as:  Transthoracic echocardiogram (TTE). During echocardiography, sound waves are used to evaluate how blood flows through your heart.  Transesophageal echocardiogram (TEE).  Cardiac monitoring. This allows your health care provider to monitor your heart rate and rhythm in real time.  Holter monitor. This is a portable device that records your heartbeat and can help diagnose heart arrhythmias. It allows your  health care provider to track your heart activity for several days, if needed.  Stress tests by exercise or by giving medicine that makes the heart beat faster. TREATMENT   Treatment depends on what may be causing your chest pain. Treatment may include:  Acid blockers for heartburn.  Anti-inflammatory medicine.  Pain medicine for inflammatory conditions.  Antibiotics if an infection is present.  You may be advised to change lifestyle habits. This includes stopping smoking and avoiding alcohol, caffeine, and chocolate.  You may be advised to keep your head raised (elevated) when sleeping. This reduces the chance of acid going backward from your stomach into your esophagus. Most of the time, nonspecific chest pain will improve within 2-3 days with rest and mild pain medicine.  HOME CARE INSTRUCTIONS   If antibiotics were prescribed, take them as directed. Finish them even if you start to feel better.  For the next few days, avoid physical activities that bring on chest pain. Continue physical activities as directed.  Do not use any tobacco products, including cigarettes, chewing tobacco, or electronic cigarettes.  Avoid drinking alcohol.  Only take medicine as directed by your health care provider.  Follow your health care provider's suggestions for further testing if your chest pain does not go away.  Keep any follow-up appointments you made. If you do not go to an appointment, you could develop lasting (chronic) problems with pain. If there is any problem keeping an appointment, call to reschedule. SEEK MEDICAL CARE IF:   Your chest pain does not go away, even after treatment.  You have a rash with  blisters on your chest.  You have a fever. SEEK IMMEDIATE MEDICAL CARE IF:   You have increased chest pain or pain that spreads to your arm, neck, jaw, back, or abdomen.  You have shortness of breath.  You have an increasing cough, or you cough up blood.  You have severe  back or abdominal pain.  You feel nauseous or vomit.  You have severe weakness.  You faint.  You have chills. This is an emergency. Do not wait to see if the pain will go away. Get medical help at once. Call your local emergency services (911 in [place] Do not drive yourself to the hospital. MAKE SURE YOU:   Understand these instructions.  Will watch your condition.  Will get help right away if you are not doing well or get worse. Document Released: 06/13/2005 Document Revised: 09/08/2013 Document Reviewed: 04/08/2008 [facility] Patient Information 2015 [place], [place]. This information is not intended to replace advice given to you by your health care provider. Make sure you discuss any questions you have with your health care provider.

## 2016-02-16 ENCOUNTER — Encounter (HOSPITAL_COMMUNITY): Payer: Self-pay | Admitting: Emergency Medicine

## 2016-02-16 ENCOUNTER — Emergency Department (HOSPITAL_COMMUNITY)
Admission: EM | Admit: 2016-02-16 | Discharge: 2016-02-16 | Disposition: A | Discharge status: Home / Self Care | Payer: 59 | Attending: Emergency Medicine | Admitting: Emergency Medicine

## 2016-02-16 DIAGNOSIS — Z792 Long term (current) use of antibiotics: Secondary | ICD-10-CM | POA: Diagnosis not present

## 2016-02-16 DIAGNOSIS — Z79891 Long term (current) use of opiate analgesic: Secondary | ICD-10-CM | POA: Diagnosis not present

## 2016-02-16 DIAGNOSIS — Y929 Unspecified place or not applicable: Secondary | ICD-10-CM | POA: Diagnosis not present

## 2016-02-16 DIAGNOSIS — X58XXXA Exposure to other specified factors, initial encounter: Secondary | ICD-10-CM | POA: Diagnosis not present

## 2016-02-16 DIAGNOSIS — Y999 Unspecified external cause status: Secondary | ICD-10-CM | POA: Insufficient documentation

## 2016-02-16 DIAGNOSIS — S01511A Laceration without foreign body of lip, initial encounter: Secondary | ICD-10-CM

## 2016-02-16 DIAGNOSIS — Z791 Long term (current) use of non-steroidal anti-inflammatories (NSAID): Secondary | ICD-10-CM | POA: Diagnosis not present

## 2016-02-16 DIAGNOSIS — Y939 Activity, unspecified: Secondary | ICD-10-CM | POA: Insufficient documentation

## 2016-02-16 DIAGNOSIS — R51 Headache: Secondary | ICD-10-CM | POA: Insufficient documentation

## 2016-02-16 MED ORDER — IBUPROFEN 400 MG PO TABS
400.0000 mg | ORAL_TABLET | Freq: Once | ORAL | Status: AC
  Administered 2016-02-16: 400 mg via ORAL
  Filled 2016-02-16: qty 1

## 2016-02-16 MED ORDER — ACETAMINOPHEN 325 MG PO TABS
ORAL_TABLET | ORAL | Status: AC
  Filled 2016-02-16: qty 2

## 2016-02-16 MED ORDER — ACETAMINOPHEN 325 MG PO TABS
650.0000 mg | ORAL_TABLET | Freq: Once | ORAL | Status: AC
  Administered 2016-02-16: 650 mg via ORAL

## 2016-02-16 NOTE — ED Notes (Signed)
Pt made aware to return if symptoms worsen or if any life threatening symptoms occur.   

## 2016-02-16 NOTE — ED Provider Notes (Signed)
CSN: [id number]     Arrival date & time 02/16/16  1102 History   First MD Initiated Contact with Patient 02/16/16 1142     Chief Complaint  Patient presents with  . Lip Laceration     (Consider location/radiation/quality/duration/timing/severity/associated sxs/prior Treatment) HPI Comments: Patient is a 35 year old female who presents to the emergency department with a complaint of laceration to the lower lip.  The patient states she sustained a laceration to the lower lip on May 27. She began to notice slight swelling at the site. She also noticed that she was on knocking the scab off of the area and it was rebleeding. Given the swelling she went to the urgent care on yesterday on May 31. She was given clindamycin to use. She states that the area seems to still be swelling maybe even more and continues to bleed at times. She came to the emergency department for additional evaluation of this laceration. She also complains of the pain now going from the lower lip up toward the right ear. She's not had any problems with swallowing, no problems with hearing and no problems with speaking.  The history is provided by the patient.    Past Medical History  Diagnosis Date  . Migraine   . Heart murmur   . Pregnancy induced hypertension   . Blood transfusion without reported diagnosis 2010    after c/s delivery   Past Surgical History  Procedure Laterality Date  . Cesarean section  2010  . Tonsillectomy  2004  . Cesarean section N/A 10/04/2013    Procedure: CESAREAN SECTION;  Surgeon: Dorien Chihuahuaara J. Richardson Doppole, MD;  Location: [facility];  Service: Obstetrics;  Laterality: N/A;   Family History  Problem Relation Age of Onset  . Hypertension Father   . Diabetes Father    Social History  Substance Use Topics  . Smoking status: Never Smoker   . Smokeless tobacco: Never Used  . Alcohol Use: No   OB History    Gravida Para Term Preterm AB TAB SAB Ectopic Multiple Living   3 2 2  1     2      Review of  Systems  Skin: Positive for wound.  Neurological: Positive for headaches.  All other systems reviewed and are negative.     Allergies  Banana and Shellfish allergy  Home Medications   Prior to Admission medications   Medication Sig Start Date End Date Taking? Authorizing Provider  clindamycin (CLEOCIN) 300 MG capsule Take 300 mg by mouth 3 (three) times daily.   Yes Historical Provider, MD  ibuprofen (ADVIL,MOTRIN) 200 MG tablet Take 800 mg by mouth every 6 (six) hours as needed.   Yes Historical Provider, MD  oxyCODONE-acetaminophen (ROXICET) 5-325 MG per tablet Take 1-2 tablets by mouth every 4 (four) hours as needed for severe pain. 10/07/13  Yes Naima Dillard, MD   BP 156/93 mmHg  Pulse 80  Temp(Src) 98.4 F (36.9 C) (Oral)  Resp 18  Ht 4\' 11"  (1.499 m)  Wt 68.04 kg  BMI 30.28 kg/m2  SpO2 100%  LMP 02/09/2016 Physical Exam  Constitutional: She is oriented to person, place, and time. She appears well-developed and well-nourished.  Non-toxic appearance.  HENT:  Head: Normocephalic.  Right Ear: Tympanic membrane and external ear normal.  Left Ear: Tympanic membrane and external ear normal.  There is a healing laceration of the lower lip. There no red streaks appreciated. There is mild-to-moderate swelling of the lower lip extending to the right lower jaw.  The external auditory canals are clear the tympanic membranes show no evidence of problem.  Eyes: EOM and lids are normal. Pupils are equal, round, and reactive to light.  Neck: Normal range of motion. Neck supple. Carotid bruit is not present.  Cardiovascular: Normal rate, regular rhythm, normal heart sounds, intact distal pulses and normal pulses.   Pulmonary/Chest: Breath sounds normal. No respiratory distress.  Abdominal: Soft. Bowel sounds are normal. There is no tenderness. There is no guarding.  Musculoskeletal: Normal range of motion.  Lymphadenopathy:       Head (right side): No submandibular adenopathy  present.       Head (left side): No submandibular adenopathy present.    She has no cervical adenopathy.  Neurological: She is alert and oriented to person, place, and time. She has normal strength. No cranial nerve deficit or sensory deficit.  Skin: Skin is warm and dry.  Psychiatric: She has a normal mood and affect. Her speech is normal.  Nursing note and vitals reviewed.   ED Course  Procedures (including critical care time) Labs Review Labs Reviewed - No data to display  Imaging Review No results found. I have personally reviewed and evaluated these images and lab results as part of my medical decision-making.   EKG Interpretation None      MDM  The laceration to the lower lip appears to be healing. This no evidence of cellulitis. There is mild puffiness of the lower lip extending toward the right lower jaw. There is no red streaks about the face, or the mucosa of the lower lip. The patient is already taking clindamycin. I've asked her to use Anbesol, ibuprofen and Tylenol for her discomfort, as she is breast-feeding at this time. Questions were answered. The patient is in agreement with this discharge plan. She will return if any emergent changes, problems, or concerns.    Final diagnoses:  Laceration of lip, initial encounter    *I have reviewed nursing notes, vital signs, and all appropriate lab and imaging results for this patient.[address], PA-C 02/16/16 1222  Eber Hong, MD 02/17/16 [phone]

## 2016-02-16 NOTE — Discharge Instructions (Signed)
You may want to use Chapstick as a protective barrier over the laceration area. Try to refrain from touching it with your tongue or fingers on. May use Anbesol, Tylenol and ibuprofen for pain. Please continue your clindamycin as directed. Please see your primary physician or return to the emergency department if any changes or problems. Mouth Laceration A mouth laceration is a deep cut inside your mouth. The cut may go into your lip or go all of the way through your mouth and cheek. The cut may involve your tongue, the insides of your check, or the upper surface of your mouth (palate). Mouth lacerations may bleed a lot and may need to be treated with stitches (sutures). HOME CARE  Take medicines only as told by your doctor.  If you were prescribed an antibiotic medicine, finish all of it even if you start to feel better.  Eat as told by your doctor. You may only be able to eat drink liquids or eat soft foods for a few days.  Rinse your mouth with a warm, salt-water rinse 4-6 times per day or as told by your doctor. You can make a salt-water rinse by mixing one tsp of salt into two cups of warm water.  Do not poke the sutures with your tongue. Doing that can loosen them.  Check your wound every day for signs of infection. It is normal to have a white or gray patch over your wound while it heals. Watch for:  Redness.  Puffiness (swelling).  Blood or pus.  Keep your mouth and teeth clean (oral hygiene) like you normally do, if possible. Gently brush your teeth with a soft, nylon-bristled toothbrush 2 times per day.  Keep all follow-up visits as told by your doctor. This is important. GET HELP IF:  You got a tetanus shot and you have swelling, really bad pain, redness, or bleeding at the injection site.  You have a fever.  Medicine does not help your pain.  You have redness, swelling, or pain at your wound that is getting worse.  You have fresh bleeding or pus coming from your  wound.  The edges of your wound break open.  Your neck or throat is puffy or tender. GET HELP RIGHT AWAY IF:  You have swelling in your face or the area under your jaw.  You have trouble breathing or swallowing.   This information is not intended to replace advice given to you by your health care provider. Make sure you discuss any questions you have with your health care provider.   Document Released: 02/20/2008 Document Revised: 01/18/2015 Document Reviewed: 08/25/2014 Elsevier Interactive Patient Education [employer]2016 Elsevier Inc.

## 2016-02-16 NOTE — ED Notes (Signed)
PT states lip laceration on 02/11/16 and stated was seen at urgent care yesterday and started on clindamycin. PT states lip was aggravated yesterday and re-opened.

## 2016-02-21 ENCOUNTER — Encounter (HOSPITAL_COMMUNITY): Payer: Self-pay | Admitting: Emergency Medicine

## 2016-02-21 ENCOUNTER — Emergency Department (HOSPITAL_COMMUNITY)
Admission: EM | Admit: 2016-02-21 | Discharge: 2016-02-21 | Disposition: A | Discharge status: Home / Self Care | Payer: 59 | Attending: Emergency Medicine | Admitting: Emergency Medicine

## 2016-02-21 DIAGNOSIS — R509 Fever, unspecified: Secondary | ICD-10-CM

## 2016-02-21 DIAGNOSIS — Z792 Long term (current) use of antibiotics: Secondary | ICD-10-CM | POA: Diagnosis not present

## 2016-02-21 DIAGNOSIS — Z79899 Other long term (current) drug therapy: Secondary | ICD-10-CM | POA: Insufficient documentation

## 2016-02-21 DIAGNOSIS — J069 Acute upper respiratory infection, unspecified: Secondary | ICD-10-CM | POA: Insufficient documentation

## 2016-02-21 DIAGNOSIS — Z791 Long term (current) use of non-steroidal anti-inflammatories (NSAID): Secondary | ICD-10-CM | POA: Diagnosis not present

## 2016-02-21 LAB — URINALYSIS, ROUTINE W REFLEX MICROSCOPIC
BILIRUBIN URINE: NEGATIVE
Glucose, UA: NEGATIVE mg/dL
Hgb urine dipstick: NEGATIVE
Leukocytes, UA: NEGATIVE
NITRITE: NEGATIVE
PH: 6 (ref 5.0–8.0)
Protein, ur: NEGATIVE mg/dL
Specific Gravity, Urine: 1.02 (ref 1.005–1.030)

## 2016-02-21 LAB — PREGNANCY, URINE: PREG TEST UR: NEGATIVE

## 2016-02-21 MED ORDER — NAPROXEN 500 MG PO TABS: 500.0000 mg | ORAL_TABLET | Freq: Two times a day (BID) | ORAL | Status: DC

## 2016-02-21 MED ORDER — ACETAMINOPHEN 500 MG PO TABS
1000.0000 mg | ORAL_TABLET | Freq: Once | ORAL | Status: AC
  Administered 2016-02-21: 1000 mg via ORAL
  Filled 2016-02-21: qty 2

## 2016-02-21 NOTE — Discharge Instructions (Signed)
Can take Tylenol along with the Naprosyn. Tylenol for fever Naprosyn will help with fever as well also help with the bodyaches and sore throat. Continue your clindamycin. Work note provided. Return for any new or worse symptoms.   Fever, Adult A fever is an increase in the body's temperature. It is usually defined as a temperature of 100F (38C) or higher. Brief mild or moderate fevers generally have no long-term effects, and they often do not require treatment. Moderate or high fevers may make you feel uncomfortable and can sometimes be a sign of a serious illness or disease. The sweating that may occur with repeated or prolonged fever may also cause dehydration. Fever is confirmed by taking a temperature with a thermometer. A measured temperature can vary with:  Age.  Time of day.  Location of the thermometer:  Mouth (oral).  Rectum (rectal).  Ear (tympanic).  Underarm (axillary).  Forehead (temporal). HOME CARE INSTRUCTIONS Pay attention to any changes in your symptoms. Take these actions to help with your condition:  Take over-the counter and prescription medicines only as told by your health care provider. Follow the dosing instructions carefully.  If you were prescribed an antibiotic medicine, take it as told by your health care provider. Do not stop taking the antibiotic even if you start to feel better.  Rest as needed.  Drink enough fluid to keep your urine clear or pale yellow. This helps to prevent dehydration.  Sponge yourself or bathe with room-temperature water to help reduce your body temperature as needed. Do not use ice water.  Do not overbundle yourself in blankets or heavy clothes. SEEK MEDICAL CARE IF:  You vomit.  You cannot eat or drink without vomiting.  You have diarrhea.  You have pain when you urinate.  Your symptoms do not improve with treatment.  You develop new symptoms.  You develop excessive weakness. SEEK IMMEDIATE MEDICAL CARE  IF:  You have shortness of breath or have trouble breathing.  You are dizzy or you faint.  You are disoriented or confused.  You develop signs of dehydration, such as a dry mouth, decreased urination, or paleness.  You develop severe pain in your abdomen.  You have persistent vomiting or diarrhea.  You develop a skin rash.  Your symptoms suddenly get worse.   This information is not intended to replace advice given to you by your health care provider. Make sure you discuss any questions you have with your health care provider.   Document Released: 02/27/2001 Document Revised: 05/25/2015 Document Reviewed: 10/28/2014 Elsevier Interactive Patient Education [employer] Elsevier Inc.

## 2016-02-21 NOTE — ED Notes (Addendum)
PT c/o fever, chills, sore thorat, body aches since this am. PT denies any OTC medications today. PT states still on clindamycin for lip laceration. PT also states urge for urination with little results that started today.

## 2016-02-21 NOTE — ED Provider Notes (Signed)
CSN: [id number]     Arrival date & time 02/21/16  1248 History   First MD Initiated Contact with Patient 02/21/16 1412     Chief Complaint  Patient presents with  . Fever     (Consider location/radiation/quality/duration/timing/severity/associated sxs/prior Treatment) Patient is a 35 y.o. female presenting with fever. The history is provided by the patient.  Fever Associated symptoms: cough, ear pain, myalgias and sore throat   Associated symptoms: no chest pain, no confusion, no congestion, no dysuria, no headaches, no nausea, no rash and no vomiting   The patient with onset of dry cough last night. This morning had fever and chills sore throat bodyaches. No nasal congestion bilateral ear pain. No dysuria no abdominal pain no nausea vomiting or diarrhea.  Past Medical History  Diagnosis Date  . Migraine   . Heart murmur   . Pregnancy induced hypertension   . Blood transfusion without reported diagnosis 2010    after c/s delivery   Past Surgical History  Procedure Laterality Date  . Cesarean section  2010  . Tonsillectomy  2004  . Cesarean section N/A 10/04/2013    Procedure: CESAREAN SECTION;  Surgeon: Dorien Chihuahua. Richardson Dopp, MD;  Location: [facility];  Service: Obstetrics;  Laterality: N/A;   Family History  Problem Relation Age of Onset  . Hypertension Father   . Diabetes Father    Social History  Substance Use Topics  . Smoking status: Never Smoker   . Smokeless tobacco: Never Used  . Alcohol Use: No   OB History    Gravida Para Term Preterm AB TAB SAB Ectopic Multiple Living   Review of Systems  Constitutional: Negative for fever.  HENT: Positive for ear pain and sore throat. Negative for congestion and trouble swallowing.   Eyes: Negative for redness and visual disturbance.  Respiratory: Positive for cough. Negative for shortness of breath.   Cardiovascular: Negative for chest pain.  Gastrointestinal: Negative for nausea, vomiting and abdominal pain.   Genitourinary: Negative for dysuria.  Musculoskeletal: Positive for myalgias.  Skin: Negative for rash.  Neurological: Negative for headaches.  Hematological: Does not bruise/bleed easily.  Psychiatric/Behavioral: Negative for confusion.      Allergies  Banana and Shellfish allergy  Home Medications   Prior to Admission medications   Medication Sig Start Date End Date Taking? Authorizing Provider  acetaminophen (TYLENOL) 500 MG tablet Take 1,000 mg by mouth every 6 (six) hours as needed for moderate pain.   Yes Historical Provider, MD  clindamycin (CLEOCIN) 300 MG capsule Take 300 mg by mouth 3 (three) times daily.   Yes Historical Provider, MD  ibuprofen (ADVIL,MOTRIN) 200 MG tablet Take 800 mg by mouth every 6 (six) hours as needed.   Yes Historical Provider, MD  naproxen (NAPROSYN) 500 MG tablet Take 1 tablet (500 mg total) by mouth 2 (two) times daily. 02/21/16   Vanetta Mulders, MD   BP 120/78 mmHg  Pulse 123  Temp(Src) 100.7 F (38.2 C) (Oral)  Resp 18  Ht  (1.499 m)  Wt 68.04 kg  BMI 30.28 kg/m2  SpO2 98%  LMP 02/09/2016 Physical Exam  Constitutional: She is oriented to person, place, and time. She appears well-developed and well-nourished. No distress.  HENT:  Head: Normocephalic and atraumatic.  Right Ear: External ear normal.  Left Ear: External ear normal.  Mouth/Throat: Oropharynx is clear and moist. No oropharyngeal exudate.  Eyes: Conjunctivae and EOM are  normal. Pupils are equal, round, and reactive to light.  Neck: Normal range of motion. Neck supple.  Cardiovascular: Normal rate, regular rhythm and normal heart sounds.   No murmur heard. Pulmonary/Chest: Effort normal and breath sounds normal. No respiratory distress. She has no wheezes. She has no rales.  Abdominal: Soft. Bowel sounds are normal. There is no tenderness.  Musculoskeletal: Normal range of motion. She exhibits no edema.  Neurological: She is alert and oriented to person, place, and  time. No cranial nerve deficit. She exhibits normal muscle tone. Coordination normal.  Skin: Skin is warm. No rash noted.  Nursing note and vitals reviewed.   ED Course  Procedures (including critical care time) Labs Review Labs Reviewed  URINALYSIS, ROUTINE W REFLEX MICROSCOPIC (NOT AT [facility]) - Abnormal; Notable for the following:    Ketones, ur TRACE (*)    All other components within normal limits  PREGNANCY, URINE   Results for orders placed or performed during the hospital encounter of 02/21/16  Urinalysis, Routine w reflex microscopic (not at [facility])  Result Value Ref Range   Color, Urine YELLOW YELLOW   APPearance CLEAR CLEAR   Specific Gravity, Urine 1.020 1.005 - 1.030   pH 6.0 5.0 - 8.0   Glucose, UA NEGATIVE NEGATIVE mg/dL   Hgb urine dipstick NEGATIVE NEGATIVE   Bilirubin Urine NEGATIVE NEGATIVE   Ketones, ur TRACE (A) NEGATIVE mg/dL   Protein, ur NEGATIVE NEGATIVE mg/dL   Nitrite NEGATIVE NEGATIVE   Leukocytes, UA NEGATIVE NEGATIVE  Pregnancy, urine  Result Value Ref Range   Preg Test, Ur NEGATIVE NEGATIVE     Imaging Review No results found. I have personally reviewed and evaluated these images and lab results as part of my medical decision-making.   EKG Interpretation None      MDM   Final diagnoses:  URI (upper respiratory infection)  Fever, unspecified fever cause   Patient with symptoms consistent of onset of upper respiratory infection. With fever but improved here with Tylenol. Patient nontoxic no acute distress. Patient's been on clindamycin since strep throat is unlikely. Patient ears are normal lungs are clear bilaterally. Will treat symptomatically. Patient will return for any new or worse symptoms. Patient without any dysuria. Clinically no concern for pneumonia at this time.    Vanetta MuldersScott London Nonaka, MD 02/21/16 [phone]

## 2016-07-26 IMAGING — DX DG CHEST 2V
2 series · 2 of 2 positions shown · non-contrast
Comparison: 10/25/2005

CLINICAL DATA: Chest pain

EXAM:
CHEST  2 VIEW

[chest pa]
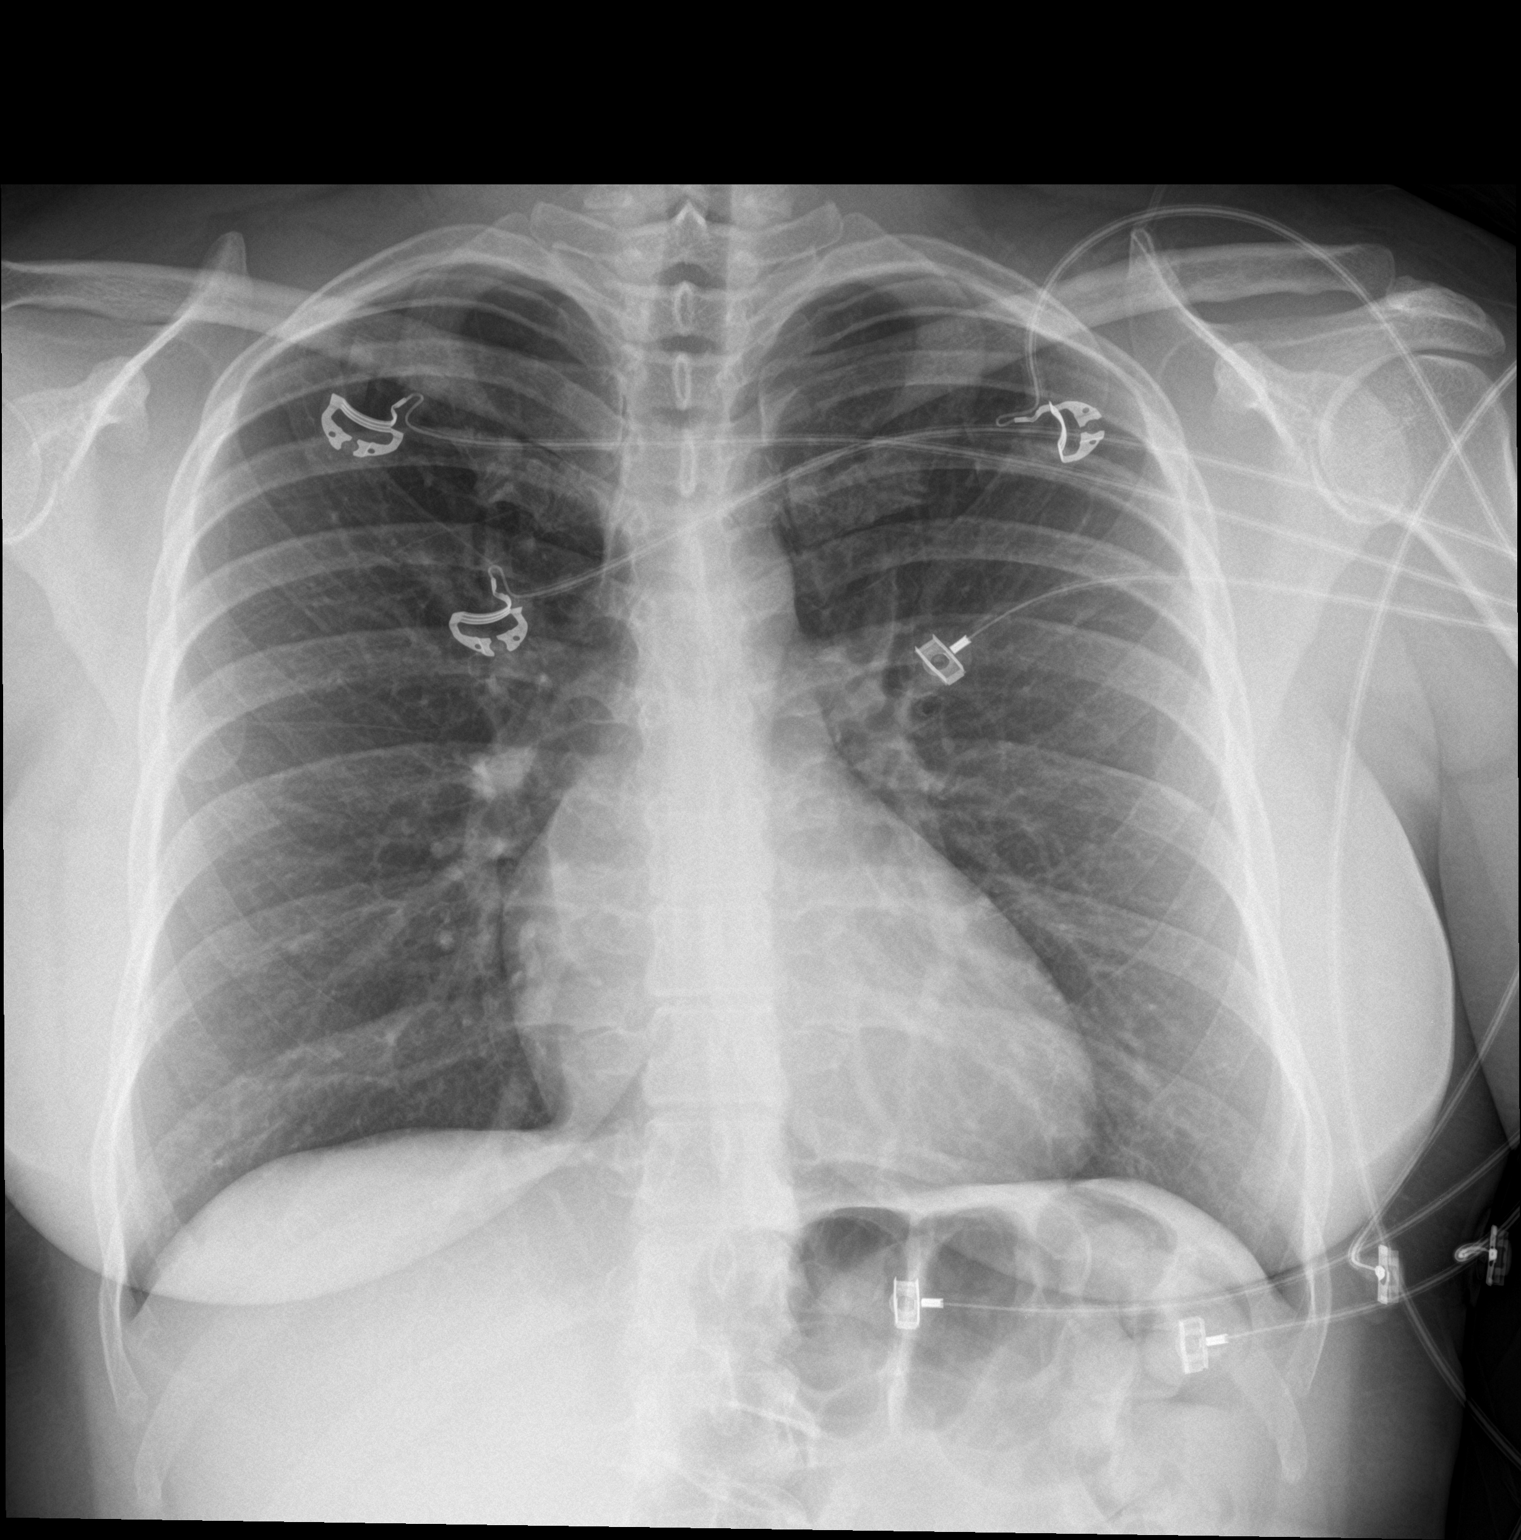

[chest lat]
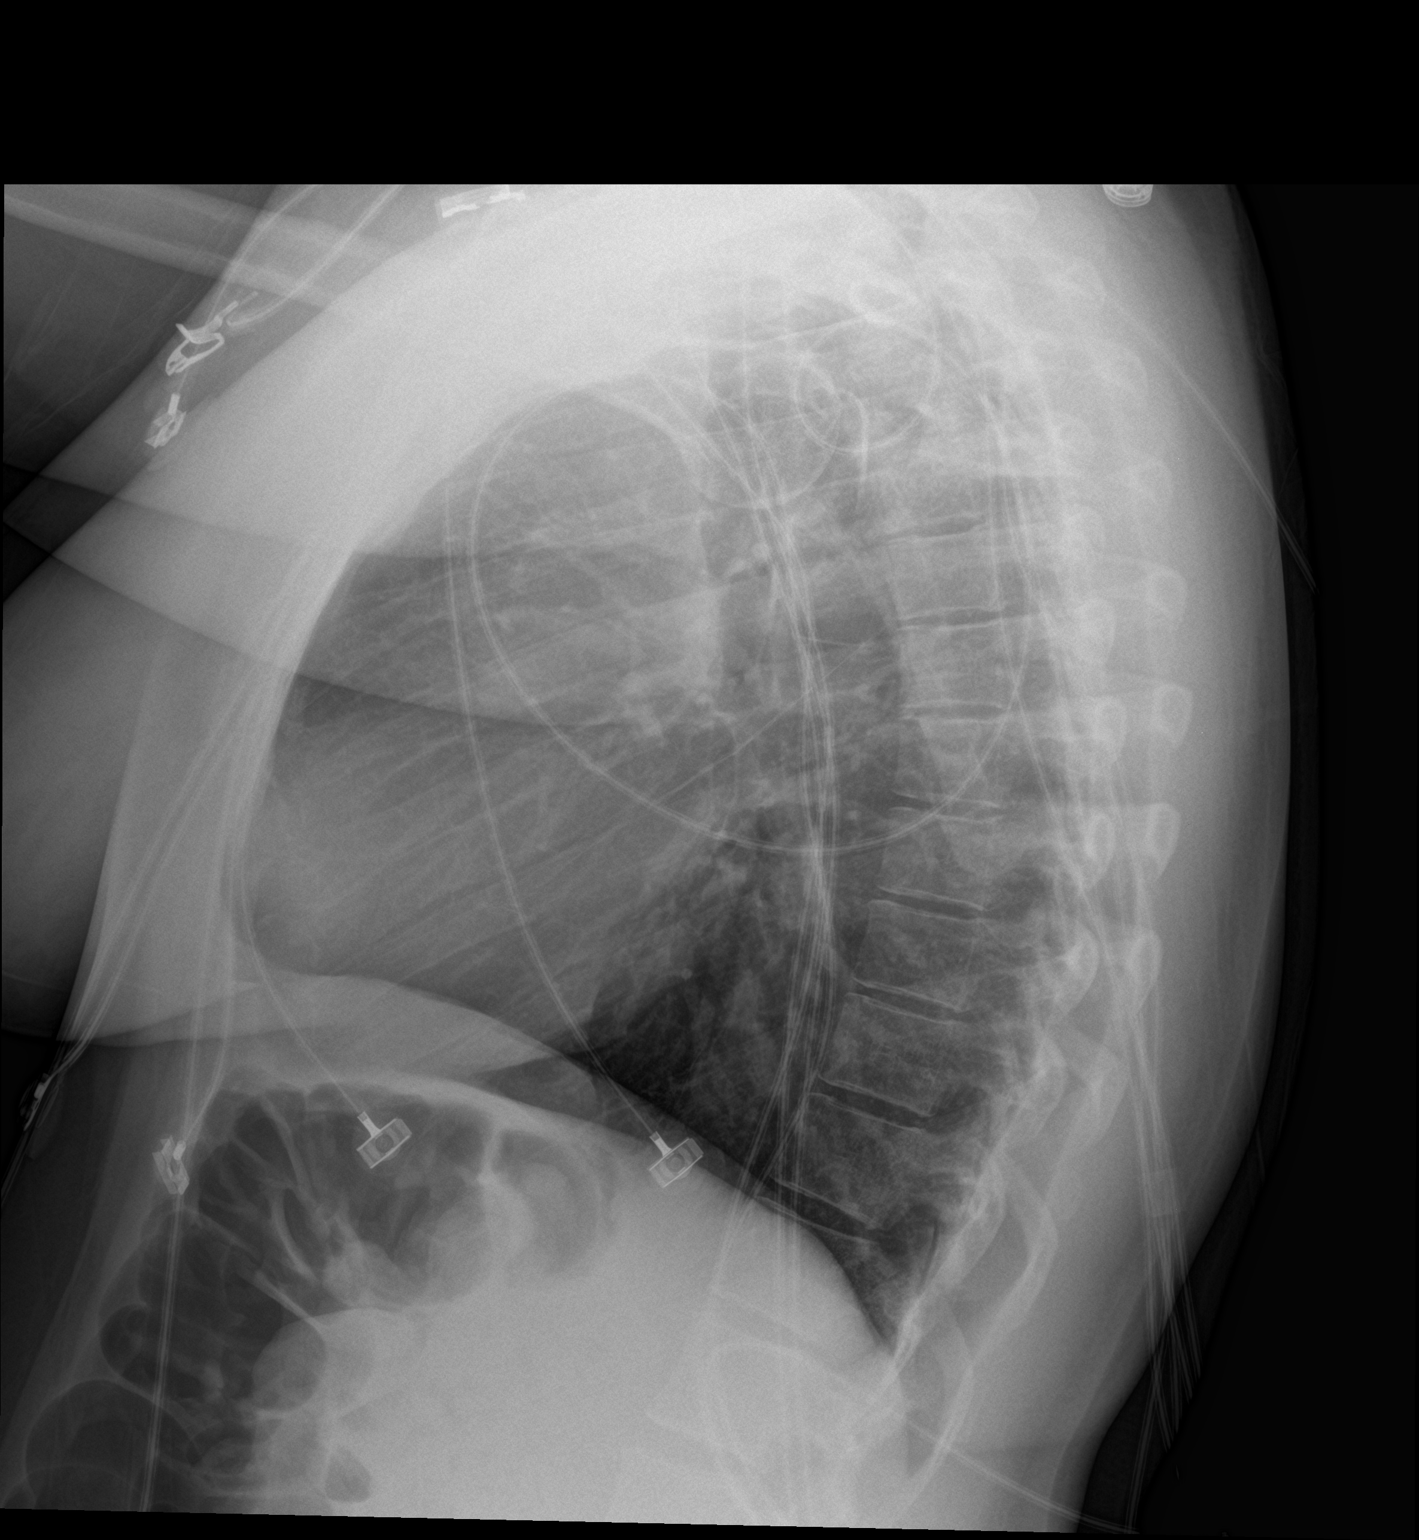

[2 of 2 positions shown; findings below may reference images not displayed]

FINDINGS: Limitation from overlapping EKG wires.

Normal heart size and mediastinal contours. No acute infiltrate or
edema. No effusion or pneumothorax. No acute osseous findings.
IMPRESSION: No active cardiopulmonary disease.

## 2016-12-02 ENCOUNTER — Emergency Department (HOSPITAL_COMMUNITY)
Admission: EM | Admit: 2016-12-02 | Discharge: 2016-12-02 | Disposition: A | Discharge status: Home / Self Care | Payer: 59 | Attending: Emergency Medicine | Admitting: Emergency Medicine

## 2016-12-02 ENCOUNTER — Encounter (HOSPITAL_COMMUNITY): Payer: Self-pay | Admitting: Emergency Medicine

## 2016-12-02 DIAGNOSIS — M791 Myalgia: Secondary | ICD-10-CM | POA: Diagnosis not present

## 2016-12-02 DIAGNOSIS — R0981 Nasal congestion: Secondary | ICD-10-CM | POA: Diagnosis not present

## 2016-12-02 DIAGNOSIS — J111 Influenza due to unidentified influenza virus with other respiratory manifestations: Secondary | ICD-10-CM

## 2016-12-02 DIAGNOSIS — R05 Cough: Secondary | ICD-10-CM | POA: Insufficient documentation

## 2016-12-02 DIAGNOSIS — R509 Fever, unspecified: Secondary | ICD-10-CM | POA: Insufficient documentation

## 2016-12-02 DIAGNOSIS — R51 Headache: Secondary | ICD-10-CM | POA: Diagnosis not present

## 2016-12-02 DIAGNOSIS — H9201 Otalgia, right ear: Secondary | ICD-10-CM | POA: Insufficient documentation

## 2016-12-02 DIAGNOSIS — R69 Illness, unspecified: Secondary | ICD-10-CM

## 2016-12-02 MED ORDER — OSELTAMIVIR PHOSPHATE 75 MG PO CAPS: 75.0000 mg | ORAL_CAPSULE | Freq: Two times a day (BID) | ORAL | 0 refills | Status: DC

## 2016-12-02 NOTE — ED Provider Notes (Signed)
[facility]EMER[facility]Y [facility]PT Provider Note   CSN: [id number] Arrival date & time: 12/02/16  1249  By signing my name below, I, Alicia Bryant, attest that this documentation has been prepared under the direction and in the presence of Alicia Brancato PA-C. Electronically Signed: Cynda AcresHailei Bryant, Scribe. 12/02/16. 1:13 PM.  History   Chief Complaint Chief Complaint  Patient presents with  . Fever    HPI Comments: Alicia Bryant is a 36 y.o. female with no pertinent medical history, who presents to the Emergency Department complaining of a persistent fever (102 max) that began one day prior to arrival.. Patient reports sick contacts at work. Patient reports associated cough, generalized body aches, headache, and right ear pain. Patient reports taking cold and sinus medications one hour prior to arrival with minimal relief. Patient did not receive the flu shot this year. Patient denies any nausea, vomiting, diarrhea, sore throat, chest pain, shortness of breath, or urinary symptoms.    The history is provided by the patient. No language interpreter was used.    Past Medical History:  Diagnosis Date  . Blood transfusion without reported diagnosis 2010   after c/s delivery  . Heart murmur   . Migraine   . Pregnancy induced hypertension     Patient Active Problem List   Diagnosis Date Noted  . Status post repeat low transverse cesarean section 10/05/2013  . PIH (pregnancy induced hypertension) 09/29/2013  . Preterm contractions 09/12/2013  . Previous cesarean delivery, antepartum condition or complication 09/12/2013  . Hx of pre-eclampsia in prior pregnancy, currently pregnant 09/12/2013  . Hx of postpartum hemorrhage, currently pregnant 09/12/2013    Past Surgical History:  Procedure Laterality Date  . CESAREAN SECTION  2010  . CESAREAN SECTION N/A 10/04/2013   Procedure: CESAREAN SECTION;  Surgeon: Alicia Chihuahuaara J. Richardson Doppole, MD;  Location: [facility];  Service: Obstetrics;  Laterality: N/A;  .  TONSILLECTOMY  2004    OB History    Gravida Para Term Preterm AB Living   3 2 2   1 2    SAB TAB Ectopic Multiple Live Births           1       Home Medications    Prior to Admission medications   Medication Sig Start Date End Date Taking? Authorizing Provider  acetaminophen (TYLENOL) 500 MG tablet Take 1,000 mg by mouth every 6 (six) hours as needed for moderate pain.    Historical Provider, MD  clindamycin (CLEOCIN) 300 MG capsule Take 300 mg by mouth 3 (three) times daily.    Historical Provider, MD  ibuprofen (ADVIL,MOTRIN) 200 MG tablet Take 800 mg by mouth every 6 (six) hours as needed.    Historical Provider, MD  naproxen (NAPROSYN) 500 MG tablet Take 1 tablet (500 mg total) by mouth 2 (two) times daily. 02/21/16   Alicia MuldersScott Zackowski, MD    Family History Family History  Problem Relation Age of Onset  . Hypertension Father   . Diabetes Father     Social History Social History  Substance Use Topics  . Smoking status: Never Smoker  . Smokeless tobacco: Never Used  . Alcohol use No     Allergies   Banana and Shellfish allergy   Review of Systems Review of Systems  Constitutional: Positive for fever.  HENT: Positive for ear pain (right). Negative for sore throat.   Respiratory: Positive for cough.   Gastrointestinal: Negative for abdominal pain, diarrhea, nausea and vomiting.  Musculoskeletal: Positive for myalgias.  Neurological: Positive for  headaches.     Physical Exam Updated Vital Signs BP 135/73 (BP Location: Left Arm)   Pulse (!) 102   Temp 99.5 F (37.5 C) (Oral)   Resp 18   Ht 4\' 11"  (1.499 m)   Wt 156 lb (70.8 kg)   LMP 11/18/2016   SpO2 100%   BMI 31.51 kg/m   Physical Exam  Constitutional: She is oriented to person, place, and time. She appears well-developed.  HENT:  Head: Normocephalic and atraumatic.  Mouth/Throat: Oropharynx is clear and moist. No oropharyngeal exudate.  Nasal congestion present. Bilateral middle ear effusion.     Eyes: Conjunctivae and EOM are normal. Pupils are equal, round, and reactive to light.  Neck: Normal range of motion. Neck supple.  Cardiovascular: Normal rate and regular rhythm.   Pulmonary/Chest: Effort normal and breath sounds normal.  Musculoskeletal: Normal range of motion.  Neurological: She is alert and oriented to person, place, and time.  Skin: Skin is warm and dry. No rash noted.  Psychiatric: She has a normal mood and affect.  Nursing note and vitals reviewed.    ED Treatments / Results  DIAGNOSTIC STUDIES: Oxygen Saturation is 100% on RA, normal by my interpretation.    COORDINATION OF CARE: 1:12 PM Discussed treatment plan with pt at bedside and pt agreed to plan, which includes tamiflu.  Labs (all labs ordered are listed, but only abnormal results are displayed) Labs Reviewed - No data to display  EKG  EKG Interpretation None       Radiology No results found.  Procedures Procedures (including critical care time)  Medications Ordered in ED Medications - No data to display   Initial Impression / Assessment and Plan / ED Course  I have reviewed the triage vital signs and the nursing notes.  Pertinent labs & imaging results that were available during my care of the patient were reviewed by me and considered in my medical decision making (see chart for details).     Pt is non toxic appearing.  Vitals stable.  Likely viral illness.  Pt appears stable for d/c.  Return precautions discussed.   Final Clinical Impressions(s) / ED Diagnoses   Final diagnoses:  Influenza-like illness    New Prescriptions Discharge Medication List as of 12/02/2016  1:17 PM    START taking these medications   Details  oseltamivir (TAMIFLU) 75 MG capsule Take 1 capsule (75 mg total) by mouth every 12 (twelve) hours., Starting Sun 12/02/2016, Print       I personally performed the services described in this documentation, which was scribed in my presence. The recorded  information has been reviewed and is accurate.     Alicia Aus, PA-C 12/05/16 2233    Alicia Berkshire, MD 12/07/16 [phone]

## 2016-12-02 NOTE — ED Notes (Signed)
flulike sx since last pm  Unrelieved by alka seltzer  No flu shot PCP Dr Parke SimmersBland

## 2016-12-02 NOTE — Discharge Instructions (Signed)
Drink plenty of fluids.  Ibuprofen 600 mg every 6-8 hrs as needed for fever or body aches.  Follow-up with your doctor for recheck if needed.  You may continue taking the OTC decongestant as directed.

## 2016-12-02 NOTE — ED Triage Notes (Signed)
Patient c/o fever with dry cough, body aches, sore throat, facial pressure, and right ear pain. Denies any drainage from ear. Patient states took cold and sinus medication an hour prior to arriving to ER. Per patient some nausea but denies any vomiting, diarrhea, or urinary symptoms.

## 2017-09-21 DIAGNOSIS — N390 Urinary tract infection, site not specified: Secondary | ICD-10-CM | POA: Diagnosis not present

## 2017-11-12 DIAGNOSIS — D509 Iron deficiency anemia, unspecified: Secondary | ICD-10-CM | POA: Diagnosis not present

## 2017-11-12 DIAGNOSIS — R5381 Other malaise: Secondary | ICD-10-CM | POA: Diagnosis not present

## 2017-11-12 DIAGNOSIS — E782 Mixed hyperlipidemia: Secondary | ICD-10-CM | POA: Diagnosis not present

## 2017-11-13 ENCOUNTER — Other Ambulatory Visit (HOSPITAL_COMMUNITY)
Admission: RE | Admit: 2017-11-13 | Discharge: 2017-11-13 | Disposition: A | Discharge status: Home / Self Care | Payer: 59 | Source: Ambulatory Visit | Attending: Obstetrics and Gynecology | Admitting: Obstetrics and Gynecology

## 2017-11-13 ENCOUNTER — Other Ambulatory Visit: Payer: Self-pay | Admitting: Obstetrics and Gynecology

## 2017-11-13 DIAGNOSIS — Z01419 Encounter for gynecological examination (general) (routine) without abnormal findings: Secondary | ICD-10-CM | POA: Diagnosis not present

## 2017-11-13 DIAGNOSIS — Z124 Encounter for screening for malignant neoplasm of cervix: Secondary | ICD-10-CM | POA: Diagnosis present

## 2017-11-14 LAB — CYTOLOGY - PAP
DIAGNOSIS: NEGATIVE
HPV: NOT DETECTED

## 2017-11-20 DIAGNOSIS — N920 Excessive and frequent menstruation with regular cycle: Secondary | ICD-10-CM | POA: Diagnosis not present

## 2017-11-22 DIAGNOSIS — R5381 Other malaise: Secondary | ICD-10-CM | POA: Diagnosis not present

## 2017-11-22 DIAGNOSIS — Z0000 Encounter for general adult medical examination without abnormal findings: Secondary | ICD-10-CM | POA: Diagnosis not present

## 2017-11-22 DIAGNOSIS — R350 Frequency of micturition: Secondary | ICD-10-CM | POA: Diagnosis not present

## 2017-11-27 DIAGNOSIS — E870 Hyperosmolality and hypernatremia: Secondary | ICD-10-CM | POA: Diagnosis not present

## 2017-11-27 DIAGNOSIS — D509 Iron deficiency anemia, unspecified: Secondary | ICD-10-CM | POA: Diagnosis not present

## 2017-11-27 DIAGNOSIS — R5381 Other malaise: Secondary | ICD-10-CM | POA: Diagnosis not present

## 2017-11-27 DIAGNOSIS — Z0000 Encounter for general adult medical examination without abnormal findings: Secondary | ICD-10-CM | POA: Diagnosis not present

## 2017-12-04 ENCOUNTER — Other Ambulatory Visit (HOSPITAL_COMMUNITY): Payer: Self-pay | Admitting: Internal Medicine

## 2017-12-04 DIAGNOSIS — E049 Nontoxic goiter, unspecified: Secondary | ICD-10-CM

## 2017-12-20 ENCOUNTER — Other Ambulatory Visit (HOSPITAL_COMMUNITY): Payer: Self-pay | Admitting: Internal Medicine

## 2017-12-20 DIAGNOSIS — E049 Nontoxic goiter, unspecified: Secondary | ICD-10-CM

## 2017-12-25 ENCOUNTER — Ambulatory Visit (HOSPITAL_COMMUNITY)
Admission: RE | Admit: 2017-12-25 | Discharge: 2017-12-25 | Disposition: A | Discharge status: Home / Self Care | Payer: 59 | Source: Ambulatory Visit | Attending: Internal Medicine | Admitting: Internal Medicine

## 2017-12-25 DIAGNOSIS — E010 Iodine-deficiency related diffuse (endemic) goiter: Secondary | ICD-10-CM | POA: Diagnosis not present

## 2017-12-25 DIAGNOSIS — E049 Nontoxic goiter, unspecified: Secondary | ICD-10-CM

## 2018-06-05 DIAGNOSIS — G4700 Insomnia, unspecified: Secondary | ICD-10-CM | POA: Diagnosis not present

## 2018-07-03 DIAGNOSIS — G4700 Insomnia, unspecified: Secondary | ICD-10-CM | POA: Diagnosis not present

## 2018-08-11 DIAGNOSIS — G4700 Insomnia, unspecified: Secondary | ICD-10-CM | POA: Diagnosis not present

## 2018-08-11 DIAGNOSIS — D509 Iron deficiency anemia, unspecified: Secondary | ICD-10-CM | POA: Diagnosis not present

## 2018-08-20 DIAGNOSIS — J069 Acute upper respiratory infection, unspecified: Secondary | ICD-10-CM | POA: Diagnosis not present

## 2018-08-20 DIAGNOSIS — E663 Overweight: Secondary | ICD-10-CM | POA: Diagnosis not present

## 2018-08-20 DIAGNOSIS — Z6828 Body mass index (BMI) 28.0-28.9, adult: Secondary | ICD-10-CM | POA: Diagnosis not present

## 2018-08-20 DIAGNOSIS — H6122 Impacted cerumen, left ear: Secondary | ICD-10-CM | POA: Diagnosis not present

## 2019-09-10 ENCOUNTER — Ambulatory Visit: Payer: 59 | Attending: Internal Medicine

## 2019-09-10 ENCOUNTER — Other Ambulatory Visit: Payer: Self-pay

## 2019-09-10 DIAGNOSIS — Z20822 Contact with and (suspected) exposure to covid-19: Secondary | ICD-10-CM

## 2019-09-11 LAB — NOVEL CORONAVIRUS, NAA: SARS-CoV-2, NAA: NOT DETECTED

## 2019-11-26 ENCOUNTER — Ambulatory Visit: Payer: 59 | Attending: Internal Medicine

## 2019-11-26 DIAGNOSIS — Z23 Encounter for immunization: Secondary | ICD-10-CM

## 2019-11-26 NOTE — Progress Notes (Signed)
   Covid-19 Vaccination Clinic  Name:  CLARRISA KAYLOR    MRN: [id number] DOB: 02/21/1981  11/26/2019  Ms. Cardamone was observed post Covid-19 immunization for 15 minutes without incident. She was provided with Vaccine Information Sheet and instruction to access the V-Safe system.   Ms. Rowles was instructed to call 911 with any severe reactions post vaccine: Marland Kitchen Difficulty breathing  . Swelling of face and throat  . A fast heartbeat  . A bad rash all over body  . Dizziness and weakness   Immunizations Administered    Name Date Dose VIS Date Route   Moderna COVID-19 Vaccine 11/26/2019 10:28 AM 0.5 mL 08/18/2019 Intramuscular   Manufacturer: Moderna   Lot: [id number]   NDC: 32761-470-92

## 2019-12-29 ENCOUNTER — Ambulatory Visit: Payer: 59 | Attending: Internal Medicine

## 2019-12-29 DIAGNOSIS — Z23 Encounter for immunization: Secondary | ICD-10-CM

## 2019-12-29 NOTE — Progress Notes (Signed)
   Covid-19 Vaccination Clinic  Name:  Alicia Bryant    MRN: [id number] DOB: Aug 09, 1981  12/29/2019  Alicia Bryant was observed post Covid-19 immunization for 15 minutes without incident. She was provided with Vaccine Information Sheet and instruction to access the V-Safe system.   Alicia Bryant was instructed to call 911 with any severe reactions post vaccine: Marland Kitchen Difficulty breathing  . Swelling of face and throat  . A fast heartbeat  . A bad rash all over body  . Dizziness and weakness   Immunizations Administered    Name Date Dose VIS Date Route   Moderna COVID-19 Vaccine 12/29/2019 11:15 AM 0.5 mL 08/18/2019 Intramuscular   Manufacturer: Moderna   Lot: [id number]   NDC: 47841-282-08

## 2020-09-22 ENCOUNTER — Ambulatory Visit: Payer: 59

## 2020-10-20 ENCOUNTER — Ambulatory Visit: Payer: 59

## 2020-11-15 ENCOUNTER — Other Ambulatory Visit: Payer: Self-pay | Admitting: Internal Medicine

## 2020-11-15 DIAGNOSIS — R928 Other abnormal and inconclusive findings on diagnostic imaging of breast: Secondary | ICD-10-CM

## 2021-01-05 ENCOUNTER — Encounter: Payer: Self-pay | Admitting: Emergency Medicine

## 2021-01-05 ENCOUNTER — Ambulatory Visit
Admission: EM | Admit: 2021-01-05 | Discharge: 2021-01-05 | Disposition: A | Discharge status: Home / Self Care | Payer: 59 | Attending: Emergency Medicine | Admitting: Emergency Medicine

## 2021-01-05 DIAGNOSIS — G43109 Migraine with aura, not intractable, without status migrainosus: Secondary | ICD-10-CM

## 2021-01-05 MED ORDER — KETOROLAC TROMETHAMINE 30 MG/ML IJ SOLN
30.0000 mg | Freq: Once | INTRAMUSCULAR | Status: AC
  Administered 2021-01-05: 30 mg via INTRAMUSCULAR

## 2021-01-05 MED ORDER — ONDANSETRON HCL 4 MG PO TABS: 4.0000 mg | ORAL_TABLET | Freq: Four times a day (QID) | ORAL | 0 refills | Status: DC

## 2021-01-05 MED ORDER — ONDANSETRON 4 MG PO TBDP
4.0000 mg | ORAL_TABLET | Freq: Once | ORAL | Status: AC
  Administered 2021-01-05: 4 mg via ORAL

## 2021-01-05 MED ORDER — DEXAMETHASONE SODIUM PHOSPHATE 10 MG/ML IJ SOLN
10.0000 mg | Freq: Once | INTRAMUSCULAR | Status: AC
  Administered 2021-01-05: 10 mg via INTRAMUSCULAR

## 2021-01-05 NOTE — Discharge Instructions (Signed)
Migraine cocktail given in office Zofran prescribed for nausea and vomiting Rest and drink plenty of fluids Use OTC medications as needed for symptomatic relief Follow up with PCP if symptoms persists Return or go to the ER if you have any new or worsening symptoms such as fever, chills, nausea, vomiting, chest pain, shortness of breath, cough, vision changes, worsening headache despite treatment, slurred speech, facial asymmetry, weakness in arms or legs, etc..Marland Kitchen

## 2021-01-05 NOTE — ED Triage Notes (Signed)
States she is unable to get her migraine medication filled until this weekend.  States she has a migraine now and feels nauseated.  Pain is located on right side of head.

## 2021-01-05 NOTE — ED Provider Notes (Signed)
[facility]   [id number] 01/05/21 Arrival Time: 0900  CH:[id number]  SUBJECTIVE:  Alicia Bryant is a 40 y.o. female who complains of migraine to RT side of head x 3 days.  Denies a precipitating event, or recent head trauma.  Patient localizes her pain to the RT side of head.  8/10. Patient has tried OTC excedrin without relief. Symptoms are made worse with light.  Reports similar symptoms in the past that improved with rx'ed migraine medication, but ran out.  This is not the worst headache of their life.  Complains of associated nausea, and aura.  Patient denies fever, chills, vomiting, rhinorrhea, watery eyes, chest pain, SOB, abdominal pain, weakness, numbness or tingling, slurred speech.     ROS: As per HPI.  All other pertinent ROS negative.     Past Medical History:  Diagnosis Date  . Blood transfusion without reported diagnosis 2010   after c/s delivery  . Heart murmur   . Migraine   . Pregnancy induced hypertension    Past Surgical History:  Procedure Laterality Date  . CESAREAN SECTION  2010  . CESAREAN SECTION N/A 10/04/2013   Procedure: CESAREAN SECTION;  Surgeon: Dorien Chihuahua. Richardson Dopp, MD;  Location: [facility];  Service: Obstetrics;  Laterality: N/A;  . TONSILLECTOMY  2004   Allergies  Allergen Reactions  . Banana Itching  . Shellfish Allergy     Throat itching    No current facility-administered medications on file prior to encounter.   Current Outpatient Medications on File Prior to Encounter  Medication Sig Dispense Refill  . acetaminophen (TYLENOL) 500 MG tablet Take 1,000 mg by mouth every 6 (six) hours as needed for moderate pain.    Marland Kitchen ibuprofen (ADVIL,MOTRIN) 200 MG tablet Take 800 mg by mouth every 6 (six) hours as needed.    . naproxen (NAPROSYN) 500 MG tablet Take 1 tablet (500 mg total) by mouth 2 (two) times daily. 14 tablet 0   Social History   Socioeconomic History  . Marital status: Married    Spouse name: Not on file  . Number of children:  Not on file  . Years of education: Not on file  . Highest education level: Not on file  Occupational History  . Not on file  Tobacco Use  . Smoking status: Never Smoker  . Smokeless tobacco: Never Used  Substance and Sexual Activity  . Alcohol use: No  . Drug use: No  . Sexual activity: Yes    Birth control/protection: None  Other Topics Concern  . Not on file  Social History Narrative  . Not on file   Social Determinants of Health   Financial Resource Strain: Not on file  Food Insecurity: Not on file  Transportation Needs: Not on file  Physical Activity: Not on file  Stress: Not on file  Social Connections: Not on file  Intimate Partner Violence: Not on file   Family History  Problem Relation Age of Onset  . Hypertension Father   . Diabetes Father     OBJECTIVE:  Vitals:   01/05/21 0915  BP: (!) 156/90  Pulse: 95  Resp: 18  Temp: 98.9 F (37.2 C)  TempSrc: Oral  SpO2: 99%    General appearance: alert; no distress Eyes: PERRLA; EOMI HENT: normocephalic; atraumatic Neck: supple with FROM Lungs: clear to auscultation bilaterally Heart: regular rate and rhythm.   Extremities: no edema; symmetrical with no gross deformities Skin: warm and dry Neurologic: CN 2-12 grossly intact; finger to nose without  difficulty; normal gait; strength and sensation intact bilaterally about the upper and lower extremities; negative pronator drift Psychological: alert and cooperative; normal mood and affect   ASSESSMENT & PLAN:  1. Migraine with aura and without status migrainosus, not intractable     Meds ordered this encounter  Medications  . ondansetron (ZOFRAN) 4 MG tablet    Sig: Take 1 tablet (4 mg total) by mouth every 6 (six) hours.    Dispense:  12 tablet    Refill:  0    Order Specific Question:   Supervising Provider    Answer:   Eustace Moore [[id number]]  . ketorolac (TORADOL) 30 MG/ML injection 30 mg  . dexamethasone (DECADRON) injection 10 mg  .  ondansetron (ZOFRAN-ODT) disintegrating tablet 4 mg    Migraine cocktail given in office Zofran prescribed for nausea and vomiting Rest and drink plenty of fluids Use OTC medications as needed for symptomatic relief Follow up with PCP if symptoms persists Return or go to the ER if you have any new or worsening symptoms such as fever, chills, nausea, vomiting, chest pain, shortness of breath, cough, vision changes, worsening headache despite treatment, slurred speech, facial asymmetry, weakness in arms or legs, etc...  Reviewed expectations re: course of current medical issues. Questions answered. Outlined signs and symptoms indicating need for more acute intervention. Patient verbalized understanding. After Visit Summary given.   Rennis Harding, PA-C 01/05/21 [phone]

## 2021-07-10 ENCOUNTER — Ambulatory Visit
Admission: EM | Admit: 2021-07-10 | Discharge: 2021-07-10 | Disposition: A | Discharge status: Home / Self Care | Payer: 59 | Attending: Urgent Care | Admitting: Urgent Care

## 2021-07-10 ENCOUNTER — Other Ambulatory Visit: Payer: Self-pay

## 2021-07-10 DIAGNOSIS — B9789 Other viral agents as the cause of diseases classified elsewhere: Secondary | ICD-10-CM | POA: Diagnosis not present

## 2021-07-10 DIAGNOSIS — J988 Other specified respiratory disorders: Secondary | ICD-10-CM | POA: Diagnosis not present

## 2021-07-10 DIAGNOSIS — H9201 Otalgia, right ear: Secondary | ICD-10-CM

## 2021-07-10 MED ORDER — CETIRIZINE HCL 10 MG PO TABS: 10.0000 mg | ORAL_TABLET | Freq: Every day | ORAL | 0 refills | Status: DC

## 2021-07-10 MED ORDER — PSEUDOEPHEDRINE HCL 30 MG PO TABS: 30.0000 mg | ORAL_TABLET | Freq: Three times a day (TID) | ORAL | 0 refills | Status: DC | PRN

## 2021-07-10 NOTE — ED Provider Notes (Signed)
[facility]   MRN: [id number] DOB: 11/24/1980  Subjective:   Alicia Bryant is a 40 y.o. female presenting for right ear fullness, muffled hearing. Had sinus congestion, sinus pressure last week that has improved.  No cough, chest pain, throat pain, shortness of breath, wheezing.  Does not want a COVID test.  No current facility-administered medications for this encounter.  Current Outpatient Medications:    acetaminophen (TYLENOL) 500 MG tablet, Take 1,000 mg by mouth every 6 (six) hours as needed for moderate pain., Disp: , Rfl:    ibuprofen (ADVIL,MOTRIN) 200 MG tablet, Take 800 mg by mouth every 6 (six) hours as needed., Disp: , Rfl:    naproxen (NAPROSYN) 500 MG tablet, Take 1 tablet (500 mg total) by mouth 2 (two) times daily., Disp: 14 tablet, Rfl: 0   ondansetron (ZOFRAN) 4 MG tablet, Take 1 tablet (4 mg total) by mouth every 6 (six) hours., Disp: 12 tablet, Rfl: 0   Allergies  Allergen Reactions   Banana Itching   Shellfish Allergy     Throat itching     Past Medical History:  Diagnosis Date   Blood transfusion without reported diagnosis 2010   after c/s delivery   Heart murmur    Migraine    Pregnancy induced hypertension      Past Surgical History:  Procedure Laterality Date   CESAREAN SECTION  2010   CESAREAN SECTION N/A 10/04/2013   Procedure: CESAREAN SECTION;  Surgeon: Dorien Chihuahua. Richardson Dopp, MD;  Location: [facility];  Service: Obstetrics;  Laterality: N/A;   TONSILLECTOMY  2004    Family History  Problem Relation Age of Onset   Healthy Mother    Hypertension Father    Diabetes Father     Social History   Tobacco Use   Smoking status: Never   Smokeless tobacco: Never  Substance Use Topics   Alcohol use: No   Drug use: No    ROS   Objective:   Vitals: BP 128/84   Pulse 84   Temp 99 F (37.2 C)   Resp 19   LMP 06/05/2021   SpO2 96%   Physical Exam Constitutional:      General: She is not in acute distress.    Appearance:  She is well-developed. She is not ill-appearing.  HENT:     Head: Normocephalic and atraumatic.     Right Ear: Tympanic membrane, ear canal and external ear normal. No drainage or tenderness. No middle ear effusion. There is no impacted cerumen. Tympanic membrane is not erythematous.     Left Ear: Tympanic membrane, ear canal and external ear normal. No drainage or tenderness.  No middle ear effusion. There is no impacted cerumen. Tympanic membrane is not erythematous.     Nose: No congestion or rhinorrhea.     Mouth/Throat:     Mouth: Mucous membranes are moist. No oral lesions.     Pharynx: No pharyngeal swelling, oropharyngeal exudate, posterior oropharyngeal erythema or uvula swelling.     Tonsils: No tonsillar exudate or tonsillar abscesses.  Eyes:     General: No scleral icterus.       Right eye: No discharge.        Left eye: No discharge.     Extraocular Movements: Extraocular movements intact.     Right eye: Normal extraocular motion.     Left eye: Normal extraocular motion.     Conjunctiva/sclera: Conjunctivae normal.     Pupils: Pupils are equal, round, and reactive to light.  Cardiovascular:     Rate and Rhythm: Normal rate.  Pulmonary:     Effort: Pulmonary effort is normal.  Musculoskeletal:     Cervical back: Normal range of motion and neck supple.  Lymphadenopathy:     Cervical: No cervical adenopathy.  Skin:    General: Skin is warm and dry.  Neurological:     General: No focal deficit present.     Mental Status: She is alert and oriented to person, place, and time.  Psychiatric:        Mood and Affect: Mood normal.        Behavior: Behavior normal.    Assessment and Plan :   PDMP not reviewed this encounter.  1. Viral respiratory illness   2. Acute otalgia, right    Initially started out with a viral illness, suspect eustachian tube dysfunction at this point.  Recommend supportive care. Counseled patient on potential for adverse effects with medications  prescribed/recommended today, ER and return-to-clinic precautions discussed, patient verbalized understanding.    Wallis Bamberg, PA-C 07/10/21 1515

## 2021-07-10 NOTE — ED Triage Notes (Signed)
Pt reports having congestion last week that has improved. Since her right ear has been muffled and feels full.

## 2022-01-09 ENCOUNTER — Encounter: Payer: Self-pay | Admitting: Emergency Medicine

## 2022-01-09 ENCOUNTER — Ambulatory Visit (INDEPENDENT_AMBULATORY_CARE_PROVIDER_SITE_OTHER): Payer: 59

## 2022-01-09 ENCOUNTER — Ambulatory Visit
Admission: EM | Admit: 2022-01-09 | Discharge: 2022-01-09 | Disposition: A | Discharge status: Home / Self Care | Payer: 59

## 2022-01-09 DIAGNOSIS — M25462 Effusion, left knee: Secondary | ICD-10-CM

## 2022-01-09 DIAGNOSIS — M25562 Pain in left knee: Secondary | ICD-10-CM

## 2022-01-09 NOTE — ED Triage Notes (Signed)
Left knee swelling x 3 to 4 weeks.  States she cant' bend knee all the way back.  Hurts to stretch knee out in the morning time. ?

## 2022-01-09 NOTE — Discharge Instructions (Addendum)
Your x-rays show you do have a small joint effusion in the left knee with some mild degenerative changes. ?RICE therapy, rest, ice, compression, and elevation to help with your symptoms. ?Recommend a compression wrap such as Ace wrap to help minimize swelling. ?May take over-the-counter ibuprofen or Tylenol as needed for pain. ?May follow-up with your previous orthopedic clinic, you can also follow-up with [facility] at [phone] and or [facility] [phone]. ?Follow-up as needed. ?

## 2022-01-09 NOTE — ED Provider Notes (Signed)
?[facility] ? ? ? ?CSN: [id number] ?Arrival date & time: 01/09/22  0805 ? ? ?  ? ?History   ?Chief Complaint ?No chief complaint on file. ? ? ?HPI ?Alicia Bryant is a 41 y.o. female.  ? ?The patient is a 41 year old female who presents with left knee pain.  Symptoms have been present for the past 3 to 4 weeks.  She denies any known injury or trauma.  Patient states that she has noticed since the onset of her symptoms that she does not have "so much pain it feels tight and like the knee is going to "pop".  She also has intermittent swelling.  She states that she notices her symptoms more with flexion of the knee and when she extends the knee out.  She denies any pain or tenderness, or radiation of pain.  She states that she does not notice worsening of pain with prolonged walking.  States that she has also been unable to attend the gym and do her regular exercises such as squatting.  She has taken ibuprofen for her symptoms. ? ?The history is provided by the patient.  ? ?Past Medical History:  ?Diagnosis Date  ? Blood transfusion without reported diagnosis 2010  ? after c/s delivery  ? Heart murmur   ? Migraine   ? Pregnancy induced hypertension   ? ? ?Patient Active Problem List  ? Diagnosis Date Noted  ? Status post repeat low transverse cesarean section 10/05/2013  ? PIH (pregnancy induced hypertension) 09/29/2013  ? Preterm contractions 09/12/2013  ? Previous cesarean delivery, antepartum condition or complication [id number]  ? Hx of pre-eclampsia in prior pregnancy, currently pregnant 09/12/2013  ? Hx of postpartum hemorrhage, currently pregnant 09/12/2013  ? ? ?Past Surgical History:  ?Procedure Laterality Date  ? CESAREAN SECTION  2010  ? CESAREAN SECTION N/A 10/04/2013  ? Procedure: CESAREAN SECTION;  Surgeon: Maeola Sarah. Landry Mellow, MD;  Location: [facility];  Service: Obstetrics;  Laterality: N/A;  ? TONSILLECTOMY  2004  ? ? ?OB History   ? ? Gravida  ?3  ? Para  ?2  ? Term  ?2  ? Preterm  ?   ? AB  ?1  ?  Living  ?2  ?  ? ? SAB  ?   ? IAB  ?   ? Ectopic  ?   ? Multiple  ?   ? Live Births  ?1  ?   ?  ?  ? ? ? ?Home Medications   ? ?Prior to Admission medications   ?Medication Sig Start Date End Date Taking? Authorizing Provider  ?traZODone (DESYREL) 50 MG tablet Take 50 mg by mouth at bedtime.   Yes [provider]  ?acetaminophen (TYLENOL) 500 MG tablet Take 1,000 mg by mouth every 6 (six) hours as needed for moderate pain.    [provider]  ?cetirizine (ZYRTEC ALLERGY) 10 MG tablet Take 1 tablet (10 mg total) by mouth daily. 07/10/21   Jaynee Eagles, PA-C  ?ibuprofen (ADVIL,MOTRIN) 200 MG tablet Take 800 mg by mouth every 6 (six) hours as needed.    [provider]  ?naproxen (NAPROSYN) 500 MG tablet Take 1 tablet (500 mg total) by mouth 2 (two) times daily. 02/21/16   Fredia Sorrow, MD  ?ondansetron (ZOFRAN) 4 MG tablet Take 1 tablet (4 mg total) by mouth every 6 (six) hours. 01/05/21   Wurst, [place], PA-C  ?pseudoephedrine (SUDAFED) 30 MG tablet Take 1 tablet (30 mg total) by mouth every 8 (  eight) hours as needed for congestion. 07/10/21   Jaynee Eagles, PA-C  ? ? ?Family History ?Family History  ?Problem Relation Age of Onset  ? Healthy Mother   ? Hypertension Father   ? Diabetes Father   ? ? ?Social History ?Social History  ? ?Tobacco Use  ? Smoking status: Never  ? Smokeless tobacco: Never  ?Substance Use Topics  ? Alcohol use: No  ? Drug use: No  ? ? ? ?Allergies   ?Banana and Shellfish allergy ? ? ?Review of Systems ?Review of Systems  ?Constitutional: Negative.   ?Musculoskeletal:  Positive for joint swelling (left knee).  ?     Left knee pain  ?Skin: Negative.   ?Psychiatric/Behavioral: Negative.    ? ? ?Physical Exam ?Triage Vital Signs ?ED Triage Vitals  ?Enc Vitals Group  ?   BP 01/09/22 0811 (!) 152/83  ?   Pulse Rate 01/09/22 0811 84  ?   Resp 01/09/22 0811 18  ?   Temp 01/09/22 0811 98.2 ?F (36.8 ?C)  ?   Temp Source 01/09/22 0811 Oral  ?   SpO2 01/09/22 0811 99 %  ?    Weight --   ?   Height --   ?   Head Circumference --   ?   Peak Flow --   ?   Pain Score 01/09/22 0812 3  ?   Pain Loc --   ?   Pain Edu? --   ?   Excl. in [place]? --   ? ?No data found. ? ?Updated Vital Signs ?BP (!) 152/83 (BP Location: Right Arm)   Pulse 84   Temp 98.2 ?F (36.8 ?C) (Oral)   Resp 18   SpO2 99%  ? ?Visual Acuity ?Right Eye Distance:   ?Left Eye Distance:   ?Bilateral Distance:   ? ?Right Eye Near:   ?Left Eye Near:    ?Bilateral Near:    ? ?Physical Exam ?Vitals reviewed.  ?Constitutional:   ?   Appearance: Normal appearance.  ?HENT:  ?   Head: Normocephalic.  ?Pulmonary:  ?   Effort: Pulmonary effort is normal.  ?Musculoskeletal:  ?   Cervical back: Normal range of motion.  ?   Left knee: Swelling and effusion present. No deformity or bony tenderness. Decreased range of motion. No tenderness.  ?   Instability Tests: Anterior drawer test negative. Posterior drawer test negative.  ?Skin: ?   General: Skin is warm and dry.  ?   Capillary Refill: Capillary refill takes less than 2 seconds.  ?Neurological:  ?   General: No focal deficit present.  ?   Mental Status: She is alert and oriented to person, place, and time.  ?   Coordination: Coordination normal.  ?Psychiatric:     ?   Mood and Affect: Mood normal.     ?   Behavior: Behavior normal.  ? ? ? ?UC Treatments / Results  ?Labs ?(all labs ordered are listed, but only abnormal results are displayed) ?Labs Reviewed - No data to display ? ?EKG ? ? ?Radiology ?DG Knee Complete 4 Views Left ? ?Result Date: 01/09/2022 ?CLINICAL DATA:  3-4 weeks of left knee pain, no known injury EXAM: LEFT KNEE - COMPLETE 4+ VIEW COMPARISON:  None. FINDINGS: No evidence of fracture or dislocation. Small suprapatellar joint effusion. Minimal tricompartment degenerative spurring. Soft tissues are unremarkable. IMPRESSION: No fracture or dislocation of the left knee. Small suprapatellar joint effusion. Minimal degenerative spurring. Electronically Signed   By: Dellis Filbert  Nance Pew M.D.   On: 01/09/2022 08:48   ? ?Procedures ?Procedures (including critical care time) ? ?Medications Ordered in UC ?Medications - No data to display ? ?Initial Impression / Assessment and Plan / UC Course  ?I have reviewed the triage vital signs and the nursing notes. ? ?Pertinent labs & imaging results that were available during my care of the patient were reviewed by me and considered in my medical decision making (see chart for details). ? ?The patient is a 41 year old female who presents with left knee pain.  Symptoms have been present for the past 3 to 4 weeks.  Patient states she does not "feel pain" but more of feeling like her knee is going to "pop" with certain movements.  On exam, there is a same fluid shift which indicates fluid.  X-rays also show a small suprapatellar joint effusion.  With some degenerative spurring.  Patient advised that she can follow-up with orthopedics for a possible drainage of the fluid.  Patient states she may follow-up with a previous orthopedic clinic that she is seeing.  We will also provide the patient with other resources to include [facility] and [facility] care.  Patient declines use of a knee brace or compression wrap today.  Advised her that she can continue over-the-counter ibuprofen or Tylenol as needed.  Recommended rice therapy to help with her symptoms.  Follow-up as needed. ?Final Clinical Impressions(s) / UC Diagnoses  ? ?Final diagnoses:  ?Knee effusion, left  ? ? ? ?Discharge Instructions   ? ?  ?Your x-rays show you do have a small joint effusion in the left knee with some mild degenerative changes. ?RICE therapy, rest, ice, compression, and elevation to help with your symptoms. ?Recommend a compression wrap such as Ace wrap to help minimize swelling. ?May take over-the-counter ibuprofen or Tylenol as needed for pain. ?May follow-up with your previous orthopedic clinic, you can also follow-up with [facility] at [phone] and or [place] [phone]. ?Follow-up as needed. ? ? ? ? ?ED Prescriptions   ?None ?  ? ?PDMP not reviewed this encounter. ?  ?Tish Men, NP ?01/09/22 [phone] ? ?

## 2023-05-18 ENCOUNTER — Encounter (HOSPITAL_COMMUNITY): Payer: Self-pay

## 2023-05-18 ENCOUNTER — Emergency Department (HOSPITAL_COMMUNITY)
Admission: EM | Admit: 2023-05-18 | Discharge: 2023-05-18 | Disposition: A | Discharge status: Home / Self Care | Payer: 59 | Attending: Emergency Medicine | Admitting: Emergency Medicine

## 2023-05-18 ENCOUNTER — Other Ambulatory Visit: Payer: Self-pay

## 2023-05-18 DIAGNOSIS — R7309 Other abnormal glucose: Secondary | ICD-10-CM

## 2023-05-18 DIAGNOSIS — I10 Essential (primary) hypertension: Secondary | ICD-10-CM | POA: Diagnosis not present

## 2023-05-18 DIAGNOSIS — E871 Hypo-osmolality and hyponatremia: Secondary | ICD-10-CM | POA: Diagnosis not present

## 2023-05-18 DIAGNOSIS — D72819 Decreased white blood cell count, unspecified: Secondary | ICD-10-CM | POA: Diagnosis not present

## 2023-05-18 DIAGNOSIS — R519 Headache, unspecified: Secondary | ICD-10-CM | POA: Diagnosis present

## 2023-05-18 DIAGNOSIS — Z79899 Other long term (current) drug therapy: Secondary | ICD-10-CM | POA: Insufficient documentation

## 2023-05-18 LAB — BASIC METABOLIC PANEL
Anion gap: 13 (ref 5–15)
BUN: 22 mg/dL — ABNORMAL HIGH (ref 6–20)
CO2: 22 mmol/L (ref 22–32)
Calcium: 10.1 mg/dL (ref 8.9–10.3)
Chloride: 99 mmol/L (ref 98–111)
Creatinine, Ser: 0.95 mg/dL (ref 0.44–1.00)
GFR, Estimated: >60 mL/min (ref >60)
Glucose, Bld: 116 mg/dL — ABNORMAL HIGH (ref 70–99)
Potassium: 3.5 mmol/L (ref 3.5–5.1)
Sodium: 134 mmol/L — ABNORMAL LOW (ref 135–145)

## 2023-05-18 LAB — CBC WITH DIFFERENTIAL/PLATELET
Abs Immature Granulocytes: 0.01 10*3/uL (ref 0.00–0.07)
Basophils Absolute: 0 10*3/uL (ref 0.0–0.1)
Basophils Relative: 1 %
Eosinophils Absolute: 0.1 10*3/uL (ref 0.0–0.5)
Eosinophils Relative: 3 %
HCT: 38.7 % (ref 36.0–46.0)
Hemoglobin: 13.5 g/dL (ref 12.0–15.0)
Immature Granulocytes: 0 %
Lymphocytes Relative: 38 %
Lymphs Abs: 1.2 10*3/uL (ref 0.7–4.0)
MCH: 32.1 pg (ref 26.0–34.0)
MCHC: 34.9 g/dL (ref 30.0–36.0)
MCV: 92.1 fL (ref 80.0–100.0)
Monocytes Absolute: 0.3 10*3/uL (ref 0.1–1.0)
Monocytes Relative: 10 %
Neutro Abs: 1.6 10*3/uL — ABNORMAL LOW (ref 1.7–7.7)
Neutrophils Relative %: 48 %
Platelets: 298 10*3/uL (ref 150–400)
RBC: 4.2 MIL/uL (ref 3.87–5.11)
RDW: 13.3 % (ref 11.5–15.5)
WBC: 3.3 10*3/uL — ABNORMAL LOW (ref 4.0–10.5)
nRBC: 0 % (ref 0.0–0.2)

## 2023-05-18 LAB — TROPONIN I (HIGH SENSITIVITY)
Troponin I (High Sensitivity): 5 ng/L (ref <18)
Troponin I (High Sensitivity): 4 ng/L (ref <18)

## 2023-05-18 MED ORDER — POTASSIUM CHLORIDE CRYS ER 20 MEQ PO TBCR
40.0000 meq | EXTENDED_RELEASE_TABLET | Freq: Once | ORAL | Status: AC
  Administered 2023-05-18: 40 meq via ORAL
  Filled 2023-05-18: qty 2

## 2023-05-18 MED ORDER — SODIUM CHLORIDE 0.9 % IV BOLUS
1000.0000 mL | Freq: Once | INTRAVENOUS | Status: AC
  Administered 2023-05-18: 1000 mL via INTRAVENOUS

## 2023-05-18 MED ORDER — PROCHLORPERAZINE EDISYLATE 10 MG/2ML IJ SOLN
10.0000 mg | Freq: Once | INTRAMUSCULAR | Status: AC
  Administered 2023-05-18: 10 mg via INTRAVENOUS
  Filled 2023-05-18: qty 2

## 2023-05-18 NOTE — ED Provider Notes (Signed)
[place] EMERGENCY DEPARTMENT AT [facility] Provider Note   CSN: [id number] Arrival date & time: 05/18/23  0208     History  Chief Complaint  Patient presents with   Hypertension    Alicia Bryant is a 42 y.o. female.  The history is provided by the patient.  Hypertension  She had an appointment with her primary care provider earlier this week and blood pressure was noted to be high she was started on hydrochlorothiazide 12.5 mg daily.  At home, her blood pressure is continued to run high.  Yesterday, she had a headache with associated nausea and vomiting but that resolved on its own.  She had stopped by her provider earlier today and blood pressure was still very elevated and she was advised to take 2 doses of her hydrochlorothiazide.  Tonight, she was awakened by a bifrontal headache but denies any visual change or nausea or vomiting tonight.  She does have history of migraines.  Blood pressure also continued to run high tonight as high as 200/110.  He denies any chest pain or dyspnea.   Home Medications Prior to Admission medications   Medication Sig Start Date End Date Taking? Authorizing Provider  hydrochlorothiazide (HYDRODIURIL) 12.5 MG tablet Take 12.5 mg by mouth daily. 05/15/23  Yes [provider]  acetaminophen (TYLENOL) 500 MG tablet Take 1,000 mg by mouth every 6 (six) hours as needed for moderate pain.    [provider]  cetirizine (ZYRTEC ALLERGY) 10 MG tablet Take 1 tablet (10 mg total) by mouth daily. 07/10/21   Wallis Bamberg, PA-C  ibuprofen (ADVIL,MOTRIN) 200 MG tablet Take 800 mg by mouth every 6 (six) hours as needed.    [provider]  naproxen (NAPROSYN) 500 MG tablet Take 1 tablet (500 mg total) by mouth 2 (two) times daily. 02/21/16   Vanetta Mulders, MD  ondansetron (ZOFRAN) 4 MG tablet Take 1 tablet (4 mg total) by mouth every 6 (six) hours. 01/05/21   Wurst, [place], PA-C  pseudoephedrine (SUDAFED) 30 MG tablet Take 1  tablet (30 mg total) by mouth every 8 (eight) hours as needed for congestion. 07/10/21   Wallis Bamberg, PA-C  traZODone (DESYREL) 50 MG tablet Take 50 mg by mouth at bedtime.    [provider]      Allergies    Banana and Shellfish allergy    Review of Systems   Review of Systems  All other systems reviewed and are negative.   Physical Exam Updated Vital Signs BP (!) 172/105 (BP Location: Left Arm)   Pulse 84   Temp 99 F (37.2 C) (Oral)   Resp 17   Ht 4\' 11"  (1.499 m)   Wt 69.4 kg   SpO2 100%   BMI 30.90 kg/m  Physical Exam Vitals and nursing note reviewed.   42 year old female, resting comfortably and in no acute distress. Vital signs are significant for elevated blood pressure. Oxygen saturation is 100%, which is normal. Head is normocephalic and atraumatic. PERRLA, EOMI. Oropharynx is clear.  Fundi show no hemorrhage or exudate.  There is no tenderness palpation over the temporalis muscles or the insertion of the paracervical muscles. Neck is nontender and supple without adenopathy. Back is nontender and there is no CVA tenderness. Lungs are clear without rales, wheezes, or rhonchi. Chest is nontender. Heart has regular rate and rhythm without murmur. Abdomen is soft, flat, nontender. Extremities have no cyanosis or edema, full range of motion is present. Skin is warm  and dry without rash. Neurologic: Mental status is normal, cranial nerves are intact, moves all extremities equally.  ED Results / Procedures / Treatments   Labs (all labs ordered are listed, but only abnormal results are displayed) Labs Reviewed  CBC WITH DIFFERENTIAL/PLATELET - Abnormal; Notable for the following components:      Result Value   WBC 3.3 (*)    Neutro Abs 1.6 (*)    All other components within normal limits  BASIC METABOLIC PANEL  TROPONIN I (HIGH SENSITIVITY)    EKG EKG Interpretation Date/Time:  Saturday May 18 2023 02:51:29 EDT Ventricular Rate:  83 PR  Interval:  133 QRS Duration:  83 QT Interval:  375 QTC Calculation: 441 R Axis:   70  Text Interpretation: Sinus rhythm Consider left ventricular hypertrophy When compared with ECG of 05/05/2015, Left ventricular hypertrophy is now present Confirmed by Dione Booze ([id number]) on 05/18/2023 3:00:01 AM  Procedures Procedures    Medications Ordered in ED Medications  sodium chloride 0.9 % bolus 1,000 mL (1,000 mLs Intravenous New Bag/Given 05/18/23 0429)  prochlorperazine (COMPAZINE) injection 10 mg (10 mg Intravenous Given 05/18/23 0420)  potassium chloride SA (KLOR-CON M) CR tablet 40 mEq (40 mEq Oral Given 05/18/23 0416)    ED Course/ Medical Decision Making/ A&P                                 Medical Decision Making Amount and/or Complexity of Data Reviewed Labs: ordered.  Risk Prescription drug management.   Elevated blood pressure which is most likely essential hypertension.  Headache which may be migraine variant.  Although her blood pressure is elevated, it is not elevated to a level that I would expect the headache to be from her hypertension.  I have reviewed and interpreted her electrocardiogram, my interpretation is left ventricular hypertrophy but no ST or T changes.  I have reviewed and interpreted her laboratory test, my interpretation is borderline hyponatremia which is not felt to be clinically significant, elevated random glucose level, mild leukopenia which is nonspecific.  I have ordered IV fluids and prochlorperazine to treat her headache and will observe her blood pressure response.  She had excellent relief of headache with above-noted treatment.  Blood pressure has come down significantly, but is still elevated.  I have advised her that she will need to be on her medication for approximately 3 weeks before an assessment can be made as to whether it is giving her adequate blood pressure control.  However, I suspect she will need at least 2 medications to achieve  appropriate control.  I am discharging her with with instructions to return if she has any new or concerning symptoms.  Final Clinical Impression(s) / ED Diagnoses Final diagnoses:  Bad headache  Elevated blood pressure reading with diagnosis of hypertension  Elevated random blood glucose level  Hyponatremia  Leukopenia, unspecified type    Rx / DC Orders ED Discharge Orders     None         Dione Booze, MD 05/18/23 [phone]

## 2023-05-18 NOTE — Discharge Instructions (Signed)
Continue to monitor your blood pressure at home.  He should check your blood pressure once a day and keep a record of it.  Take that record with you whenever you see your primary care provider.  Return to the emergency department if you have any new or concerning symptoms.

## 2023-05-18 NOTE — ED Triage Notes (Signed)
Pt arrived via POV from home c/o hypertension PTA. Pt endorses HA, nausea. Pt reports calling PCP and was advised to take 2 of her 12.5 mg hydrochlorothiazide. Pts Bpin Triage is 189/116.

## 2023-07-26 ENCOUNTER — Ambulatory Visit
Admission: RE | Admit: 2023-07-26 | Discharge: 2023-07-26 | Disposition: A | Discharge status: Home / Self Care | Payer: 59 | Source: Ambulatory Visit

## 2023-07-26 ENCOUNTER — Other Ambulatory Visit: Payer: Self-pay

## 2023-07-26 VITALS — BP 156/95 | HR 70 | Temp 98.2°F | Resp 20

## 2023-07-26 DIAGNOSIS — J069 Acute upper respiratory infection, unspecified: Secondary | ICD-10-CM | POA: Diagnosis not present

## 2023-07-26 MED ORDER — BENZONATATE 100 MG PO CAPS
100.0000 mg | ORAL_CAPSULE | Freq: Three times a day (TID) | ORAL | 0 refills | Status: AC | PRN
Start: 2023-07-26 — End: ?

## 2023-07-26 MED ORDER — PROMETHAZINE-DM 6.25-15 MG/5ML PO SYRP
5.0000 mL | ORAL_SOLUTION | Freq: Every evening | ORAL | 0 refills | Status: AC | PRN
Start: 2023-07-26 — End: ?

## 2023-07-26 NOTE — Discharge Instructions (Signed)
 You have a viral upper respiratory infection.  Symptoms should improve over the next week to 10 days.  If you develop chest pain or shortness of breath, go to the emergency room.   Some things that can make you feel better are: - Increased rest - Increasing fluid with water/sugar free electrolytes - Acetaminophen and ibuprofen as needed for fever/pain - Salt water gargling, chloraseptic spray and throat lozenges for sore throat - OTC guaifenesin (Mucinex) 600 mg twice daily for congestion - Saline sinus flushes or a neti pot for congestion - Humidifying the air -Tessalon Perles every 8 hours as needed for dry cough and cough syrup at night time as needed for cough

## 2023-07-26 NOTE — ED Provider Notes (Addendum)
[facility]    CSN: [id number] Arrival date & time: 07/26/23  1453      History   Chief Complaint Chief Complaint  Patient presents with   Sore Throat    Cold symptoms - Entered by patient    HPI Alicia Bryant is a 42 y.o. female.   Patient presents today with 5-day history of congested cough, runny and stuffy nose, postnasal drainage, sore throat and headache, right ear pain without drainage, decreased appetite, and fatigue.  She denies chest pain, shortness of breath, decreased hearing from her ears, abdominal pain, nausea/vomiting, and diarrhea.  Reports that she was around a coworker who was sick with similar symptoms prior to her starting.  Has been taking ibuprofen and Mucinex which she thinks has helped a little bit.  Patient is confident she is not pregnant; she long cycles oral contraceptives and therefore does not have menstrual cycle.    Past Medical History:  Diagnosis Date   Blood transfusion without reported diagnosis 2010   after c/s delivery   Heart murmur    Migraine    Pregnancy induced hypertension     Patient Active Problem List   Diagnosis Date Noted   Status post repeat low transverse cesarean section 10/05/2013   PIH (pregnancy induced hypertension) 09/29/2013   Preterm contractions 09/12/2013   Previous cesarean delivery, antepartum condition or complication 09/12/2013   Hx of pre-eclampsia in prior pregnancy, currently pregnant 09/12/2013   Hx of postpartum hemorrhage, currently pregnant 09/12/2013    Past Surgical History:  Procedure Laterality Date   CESAREAN SECTION  2010   CESAREAN SECTION N/A 10/04/2013   Procedure: CESAREAN SECTION;  Surgeon: Dorien Chihuahua. Richardson Dopp, MD;  Location: [facility];  Service: Obstetrics;  Laterality: N/A;   TONSILLECTOMY  2004    OB History     Gravida  3   Para  2   Term  2   Preterm      AB  1   Living  2      SAB      IAB      Ectopic      Multiple      Live Births  1             Home Medications    Prior to Admission medications   Medication Sig Start Date End Date Taking? Authorizing Provider  benzonatate (TESSALON) 100 MG capsule Take 1 capsule (100 mg total) by mouth 3 (three) times daily as needed for cough. Do not take with alcohol or while driving or operating heavy machinery.  May cause drowsiness. 07/26/23  Yes Valentino Nose, NP  losartan-hydrochlorothiazide (HYZAAR) 50-12.5 MG tablet Take 1 tablet by mouth daily.   Yes [provider]  norethindrone-ethinyl estradiol-iron (LOESTRIN FE) 1.5-30 MG-MCG tablet Take 1 tablet by mouth daily.   Yes [provider]  promethazine-dextromethorphan (PROMETHAZINE-DM) 6.25-15 MG/5ML syrup Take 5 mLs by mouth at bedtime as needed for cough. Do not take with alcohol or while driving or operating heavy machinery.  May cause drowsiness. 07/26/23  Yes Valentino Nose, NP  QUEtiapine (SEROQUEL) 50 MG tablet Take 50 mg by mouth at bedtime.   Yes [provider]  rosuvastatin (CRESTOR) 20 MG tablet Take 20 mg by mouth daily.   Yes [provider]  sertraline (ZOLOFT) 25 MG tablet Take 25 mg by mouth daily.   Yes [provider]  acetaminophen (TYLENOL) 500 MG tablet Take 1,000 mg by mouth every 6 (  six) hours as needed for moderate pain.    [provider]  traZODone (DESYREL) 50 MG tablet Take 50 mg by mouth at bedtime.    [provider]    Family History Family History  Problem Relation Age of Onset   Healthy Mother    Hypertension Father    Diabetes Father     Social History Social History   Tobacco Use   Smoking status: Never   Smokeless tobacco: Never  Vaping Use   Vaping status: Never Used  Substance Use Topics   Alcohol use: No   Drug use: No     Allergies   Banana and Shellfish allergy   Review of Systems Review of Systems Per HPI  Physical Exam Triage Vital Signs ED Triage Vitals  Encounter Vitals Group     BP  07/26/23 1506 (!) 156/95     Systolic BP Percentile --      Diastolic BP Percentile --      Pulse Rate 07/26/23 1506 70     Resp 07/26/23 1506 20     Temp 07/26/23 1506 98.2 F (36.8 C)     Temp Source 07/26/23 1506 Oral     SpO2 07/26/23 1506 95 %     Weight --      Height --      Head Circumference --      Peak Flow --      Pain Score 07/26/23 1502 8     Pain Loc --      Pain Education --      Exclude from Growth Chart --    No data found.  Updated Vital Signs BP (!) 156/95 (BP Location: Right Arm)   Pulse 70   Temp 98.2 F (36.8 C) (Oral)   Resp 20   SpO2 95%   Breastfeeding No   Visual Acuity Right Eye Distance:   Left Eye Distance:   Bilateral Distance:    Right Eye Near:   Left Eye Near:    Bilateral Near:     Physical Exam Vitals and nursing note reviewed.  Constitutional:      General: She is not in acute distress.    Appearance: Normal appearance. She is not ill-appearing or toxic-appearing.  HENT:     Head: Normocephalic and atraumatic.     Right Ear: Tympanic membrane, ear canal and external ear normal. No drainage, swelling or tenderness. No middle ear effusion. Tympanic membrane is not erythematous.     Left Ear: Tympanic membrane, ear canal and external ear normal. No drainage, swelling or tenderness.  No middle ear effusion. Tympanic membrane is not erythematous.     Nose: Congestion present. No rhinorrhea.     Mouth/Throat:     Mouth: Mucous membranes are moist.     Pharynx: Oropharynx is clear. No oropharyngeal exudate or posterior oropharyngeal erythema.  Eyes:     General: No scleral icterus.    Extraocular Movements: Extraocular movements intact.  Cardiovascular:     Rate and Rhythm: Normal rate and regular rhythm.  Pulmonary:     Effort: Pulmonary effort is normal. No respiratory distress.     Breath sounds: Normal breath sounds. No wheezing, rhonchi or rales.  Abdominal:     General: Abdomen is flat. Bowel sounds are normal. There is  no distension.     Palpations: Abdomen is soft.  Musculoskeletal:     Cervical back: Normal range of motion and neck supple.  Lymphadenopathy:     Cervical:  No cervical adenopathy.  Skin:    General: Skin is warm and dry.     Coloration: Skin is not jaundiced or pale.     Findings: No erythema or rash.  Neurological:     Mental Status: She is alert and oriented to person, place, and time.     Motor: No weakness.  Psychiatric:        Mood and Affect: Mood normal.        Behavior: Behavior normal.      UC Treatments / Results  Labs (all labs ordered are listed, but only abnormal results are displayed) Labs Reviewed - No data to display  EKG   Radiology No results found.  Procedures Procedures (including critical care time)  Medications Ordered in UC Medications - No data to display  Initial Impression / Assessment and Plan / UC Course  I have reviewed the triage vital signs and the nursing notes.  Pertinent labs & imaging results that were available during my care of the patient were reviewed by me and considered in my medical decision making (see chart for details).   Patient is well-appearing, normotensive, afebrile, not tachycardic, not tachypneic, oxygenating well on room air.    1. Viral URI with cough Suspect viral etiology Vitals and exam are reassuring Supportive care discussed with patient, start cough suppressant medication Viral testing deferred given length of symptoms Return and ER precautions discussed  The patient was given the opportunity to ask questions.  All questions answered to their satisfaction.  The patient is in agreement to this plan.    Final Clinical Impressions(s) / UC Diagnoses   Final diagnoses:  Viral URI with cough     Discharge Instructions      You have a viral upper respiratory infection.  Symptoms should improve over the next week to 10 days.  If you develop chest pain or shortness of breath, go to the emergency  room.  Some things that can make you feel better are: - Increased rest - Increasing fluid with water/sugar free electrolytes - Acetaminophen and ibuprofen as needed for fever/pain - Salt water gargling, chloraseptic spray and throat lozenges for sore throat - OTC guaifenesin (Mucinex) 600 mg twice daily for congestion - Saline sinus flushes or a neti pot for congestion - Humidifying the air -Tessalon Perles every 8 hours as needed for dry cough and cough syrup at night time as needed for cough     ED Prescriptions     Medication Sig Dispense Auth. Provider   benzonatate (TESSALON) 100 MG capsule Take 1 capsule (100 mg total) by mouth 3 (three) times daily as needed for cough. Do not take with alcohol or while driving or operating heavy machinery.  May cause drowsiness. 21 capsule Cathlean Marseilles A, NP   promethazine-dextromethorphan (PROMETHAZINE-DM) 6.25-15 MG/5ML syrup Take 5 mLs by mouth at bedtime as needed for cough. Do not take with alcohol or while driving or operating heavy machinery.  May cause drowsiness. 118 mL Valentino Nose, NP      PDMP not reviewed this encounter.   Valentino Nose, NP 07/26/23 1627    Valentino Nose, NP 07/26/23 1734

## 2023-07-26 NOTE — ED Triage Notes (Signed)
Pt reports facial pressure, nasal congestion, body aches, right ear pain, chills, sore throat since Wednesday. Denies any known fevers.

## 2024-09-15 ENCOUNTER — Encounter (HOSPITAL_COMMUNITY): Payer: Self-pay

## 2024-09-15 ENCOUNTER — Other Ambulatory Visit: Payer: Self-pay

## 2024-09-15 ENCOUNTER — Emergency Department (HOSPITAL_COMMUNITY)
Admission: EM | Admit: 2024-09-15 | Discharge: 2024-09-15 | Discharge status: Against Medical Advice | Attending: Emergency Medicine | Admitting: Emergency Medicine

## 2024-09-15 ENCOUNTER — Emergency Department (HOSPITAL_COMMUNITY)

## 2024-09-15 DIAGNOSIS — R0789 Other chest pain: Secondary | ICD-10-CM | POA: Diagnosis present

## 2024-09-15 DIAGNOSIS — Z5321 Procedure and treatment not carried out due to patient leaving prior to being seen by health care provider: Secondary | ICD-10-CM | POA: Diagnosis not present

## 2024-09-15 LAB — CBC WITH DIFFERENTIAL/PLATELET
Abs Immature Granulocytes: 0.02 K/uL (ref 0.00–0.07)
Basophils Absolute: 0 K/uL (ref 0.0–0.1)
Basophils Relative: 0 %
Eosinophils Absolute: 0 K/uL (ref 0.0–0.5)
Eosinophils Relative: 0 %
HCT: 38.8 % (ref 36.0–46.0)
Hemoglobin: 13.2 g/dL (ref 12.0–15.0)
Immature Granulocytes: 0 %
Lymphocytes Relative: 16 %
Lymphs Abs: 0.9 K/uL (ref 0.7–4.0)
MCH: 32.4 pg (ref 26.0–34.0)
MCHC: 34 g/dL (ref 30.0–36.0)
MCV: 95.3 fL (ref 80.0–100.0)
Monocytes Absolute: 0.4 K/uL (ref 0.1–1.0)
Monocytes Relative: 7 %
Neutro Abs: 4.3 K/uL (ref 1.7–7.7)
Neutrophils Relative %: 77 %
Platelets: 311 K/uL (ref 150–400)
RBC: 4.07 MIL/uL (ref 3.87–5.11)
RDW: 13.4 % (ref 11.5–15.5)
WBC: 5.6 K/uL (ref 4.0–10.5)
nRBC: 0 % (ref 0.0–0.2)

## 2024-09-15 LAB — COMPREHENSIVE METABOLIC PANEL WITH GFR
ALT: 45 U/L — ABNORMAL HIGH (ref 0–44)
AST: 37 U/L (ref 15–41)
Albumin: 4.6 g/dL (ref 3.5–5.0)
Alkaline Phosphatase: 40 U/L (ref 38–126)
Anion gap: 16 — ABNORMAL HIGH (ref 5–15)
BUN: 19 mg/dL (ref 6–20)
CO2: 23 mmol/L (ref 22–32)
Calcium: 9.9 mg/dL (ref 8.9–10.3)
Chloride: 98 mmol/L (ref 98–111)
Creatinine, Ser: 0.87 mg/dL (ref 0.44–1.00)
GFR, Estimated: >60 mL/min
Glucose, Bld: 81 mg/dL (ref 70–99)
Potassium: 3.1 mmol/L — ABNORMAL LOW (ref 3.5–5.1)
Sodium: 136 mmol/L (ref 135–145)
Total Bilirubin: 0.5 mg/dL (ref 0.0–1.2)
Total Protein: 8.2 g/dL — ABNORMAL HIGH (ref 6.5–8.1)

## 2024-09-15 LAB — LIPASE, BLOOD: Lipase: 18 U/L (ref 11–51)

## 2024-09-15 LAB — TROPONIN T, HIGH SENSITIVITY: Troponin T High Sensitivity: <15 ng/L (ref 0–19)

## 2024-09-15 LAB — D-DIMER, QUANTITATIVE: D-Dimer, Quant: <0.27 ug{FEU}/mL (ref 0.00–0.50)

## 2024-09-15 NOTE — ED Triage Notes (Signed)
 Pt bib ems from work for sudden onset chest pain while sitting down at her desk. Nothing makes it better or worse.  Pt describes as 9/10 pressure that radiates to her right arm.  Pt given 324 ASA

## 2024-09-15 NOTE — ED Provider Triage Note (Signed)
 Emergency Medicine Provider Triage Evaluation Note  Alicia Bryant , a 43 y.o. female  was evaluated in triage.  Pt complains of chest pain.  No recent surgery or travel.  Is on estrogen birth control.  Occurred while she was sitting at her desk.  Thought it was may be muscular but seem to get little bit worse.  She has not really had pain like this before.  Denies fever.  Maybe worse when she takes deep breath.  Review of Systems  Positive: Chest pain Negative: Shortness of breath  Physical Exam  BP (!) 158/77   Pulse 83   Temp 98.7 F (37.1 C) (Oral)   Resp 18   Ht 4' 11 (1.499 m)   Wt 68 kg   LMP  (LMP Unknown)   SpO2 100%   BMI 30.30 kg/m  Gen:   Awake, no distress   Resp:  Normal effort  MSK:   Moves extremities without difficulty  Other:    Medical Decision Making  Medically screening exam initiated at 12:34 PM.  Appropriate orders placed.  Alicia Bryant was informed that the remainder of the evaluation will be completed by another provider, this initial triage assessment does not replace that evaluation, and the importance of remaining in the ED until their evaluation is complete.     Ruthe Cornet, DO 09/15/24 1235

## 2024-09-15 NOTE — ED Notes (Signed)
 This RN notified by registration that pt left and is going to PCP tomorrow
